# Patient Record
Sex: Female | Born: 1955 | Race: White | Hispanic: No | Marital: Married | Smoking: Former smoker
Health system: Southern US, Community
[De-identification: ages and names within clinical notes are randomized; demographics above are authoritative.]

## PROBLEM LIST (undated history)

## (undated) ENCOUNTER — Ambulatory Visit: Admission: EM | Source: Home / Self Care

## (undated) DIAGNOSIS — F419 Anxiety disorder, unspecified: Secondary | ICD-10-CM

## (undated) DIAGNOSIS — F32A Depression, unspecified: Secondary | ICD-10-CM

## (undated) DIAGNOSIS — C801 Malignant (primary) neoplasm, unspecified: Secondary | ICD-10-CM

## (undated) DIAGNOSIS — F329 Major depressive disorder, single episode, unspecified: Secondary | ICD-10-CM

## (undated) HISTORY — PX: TUBAL LIGATION: SHX77

---

## 2006-09-24 ENCOUNTER — Ambulatory Visit: Payer: Self-pay | Admitting: Internal Medicine

## 2006-11-22 ENCOUNTER — Ambulatory Visit: Payer: Self-pay | Admitting: Internal Medicine

## 2007-02-18 ENCOUNTER — Ambulatory Visit: Payer: Self-pay | Admitting: Internal Medicine

## 2008-07-15 ENCOUNTER — Ambulatory Visit: Payer: Self-pay | Admitting: Internal Medicine

## 2010-01-04 ENCOUNTER — Ambulatory Visit: Payer: Self-pay | Admitting: Internal Medicine

## 2010-03-25 ENCOUNTER — Ambulatory Visit: Payer: Self-pay | Admitting: Internal Medicine

## 2010-04-13 ENCOUNTER — Ambulatory Visit: Payer: Self-pay | Admitting: Family Medicine

## 2010-04-15 ENCOUNTER — Ambulatory Visit: Payer: Self-pay | Admitting: Family Medicine

## 2010-05-07 IMAGING — US US PELV - US TRANSVAGINAL
1 series · 17 of 25 positions shown · non-contrast
Comparison: none

REASON FOR EXAM: [id number] LOWER QUAD ABD PAIN  AND PELVIC PAIN  7 DAYS
AND LOW GRADE FEVER
COMMENTS:

PROCEDURE:     US  - US PELVIS MASS EXAM W/TRANSVAGI  - July 15, 2008  [DATE]
RESULT:     Comparison: No comparison
INDICATION: lower quadrant pain LMP 3 weeks ago
TECHNIQUE: Multiple transabdominal and endovaginal gray-scale images of the
pelvis performed.

[Series 1: us pelv - us transvaginal · 17 of 71 slices shown]
[im 1/71]
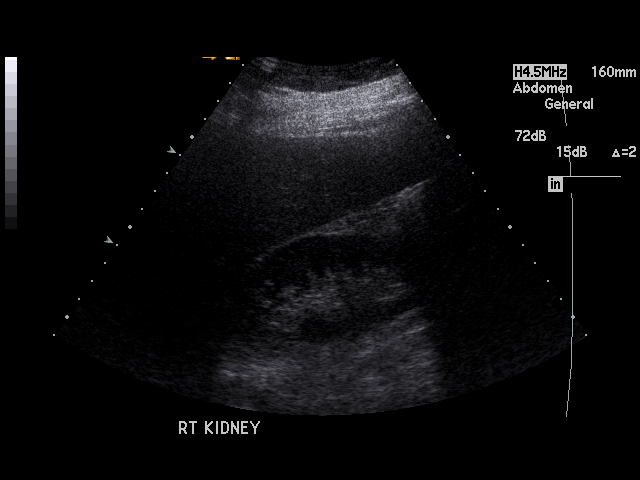
[im 6/71]
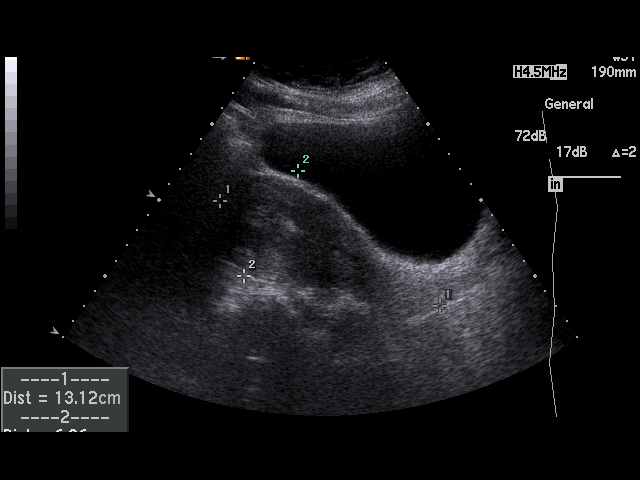
[im 9/71]
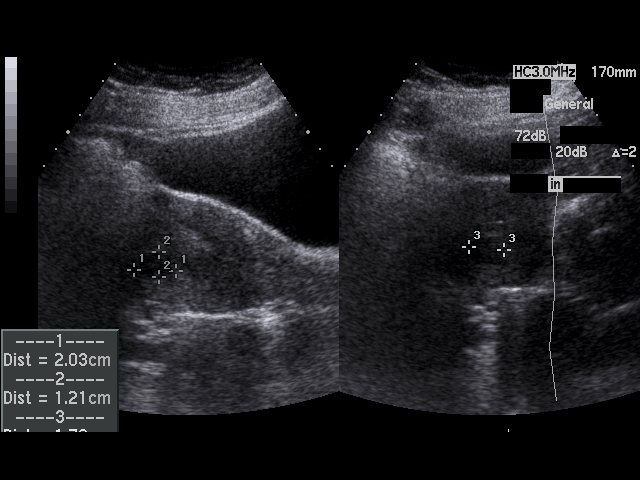
[im 15/71]
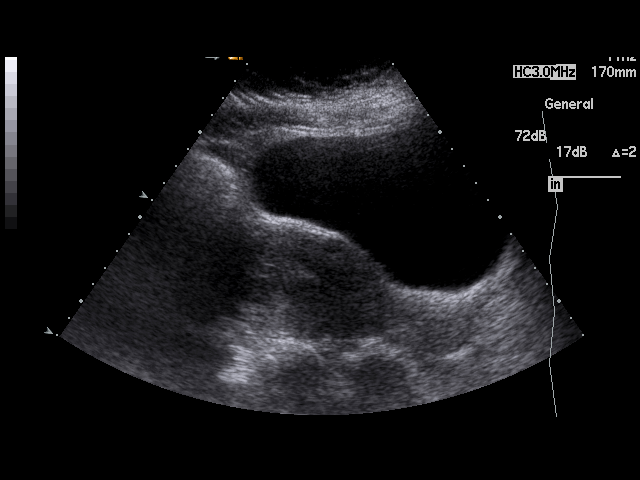
[im 18/71]
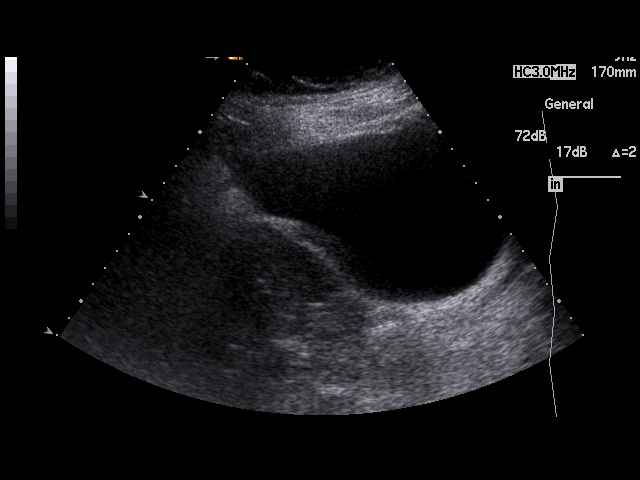
[im 24/71]
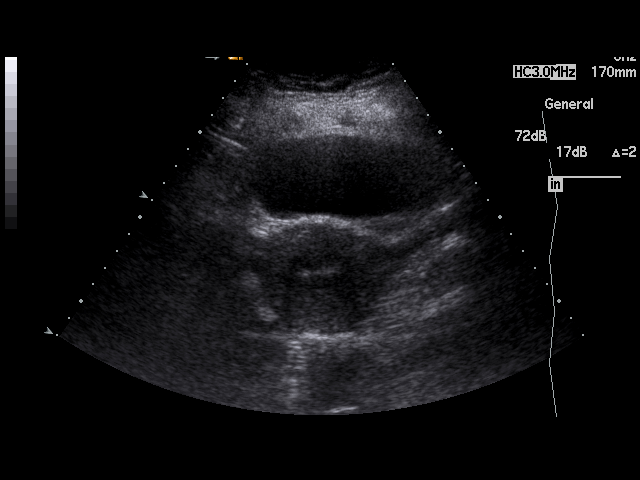
[im 27/71]
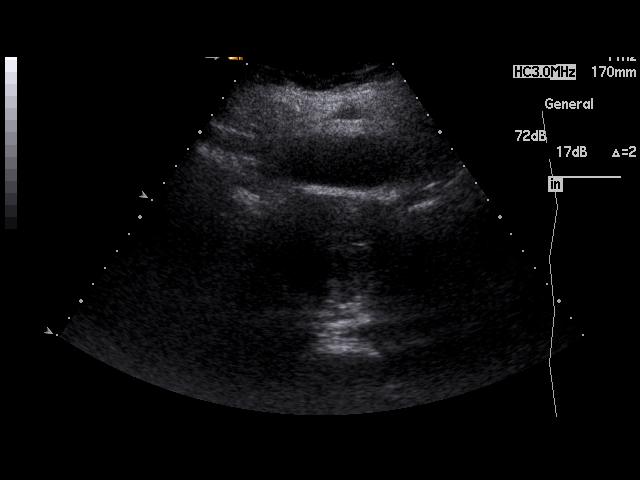
[im 33/71]
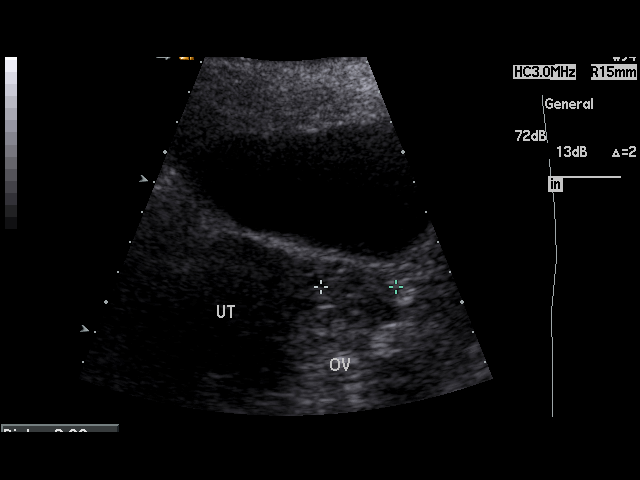
[im 36/71]
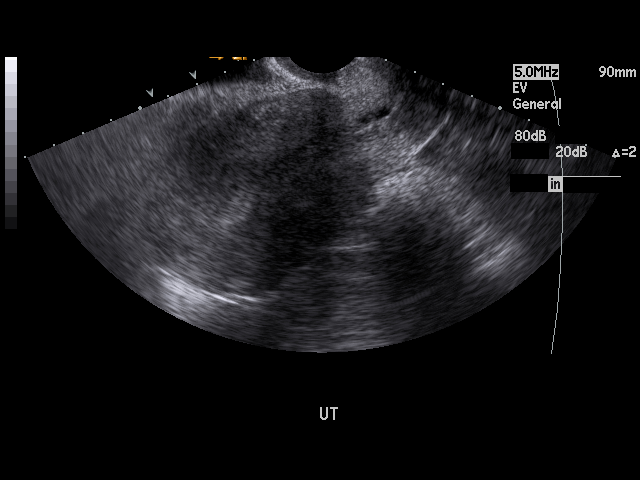
[im 38/71]
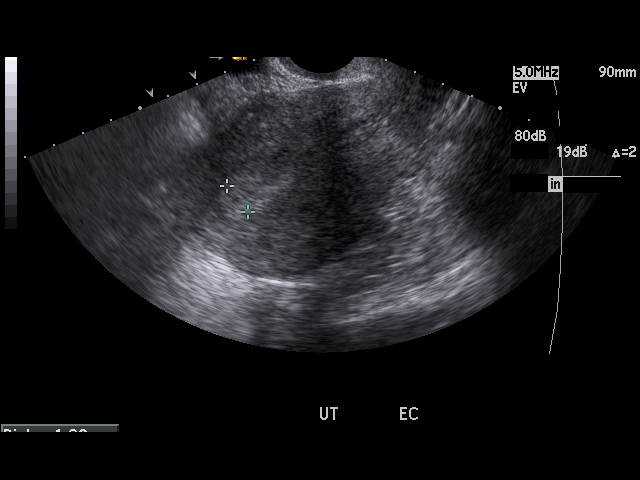
[im 44/71]
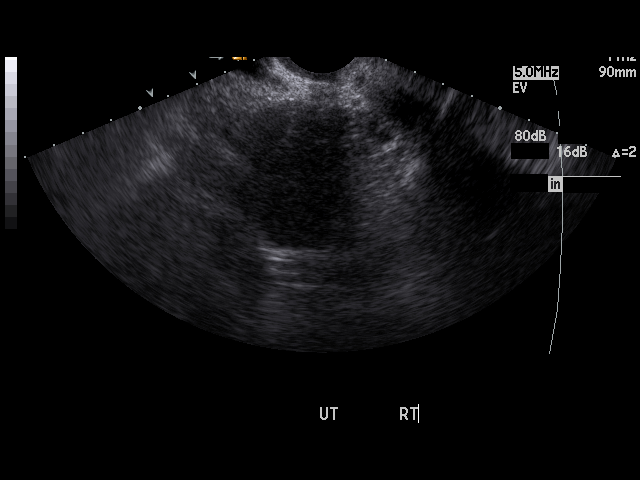
[im 47/71]
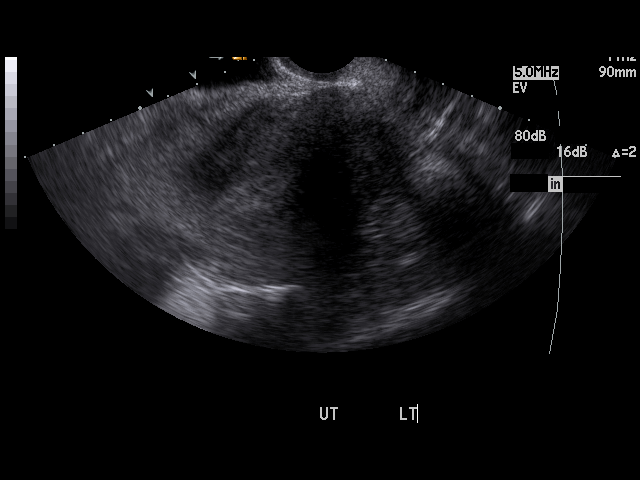
[im 53/71]
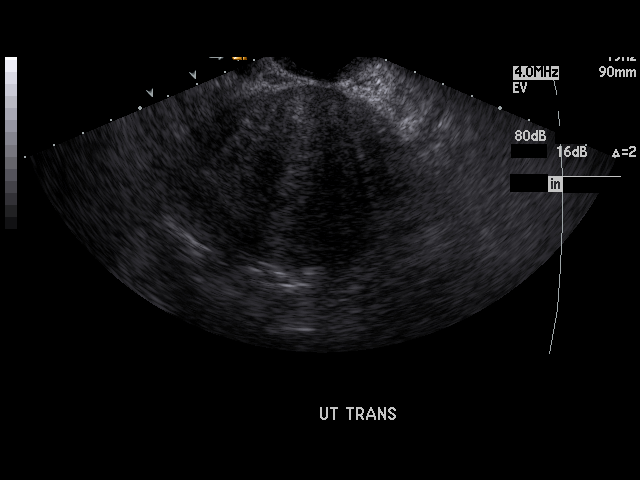
[im 56/71]
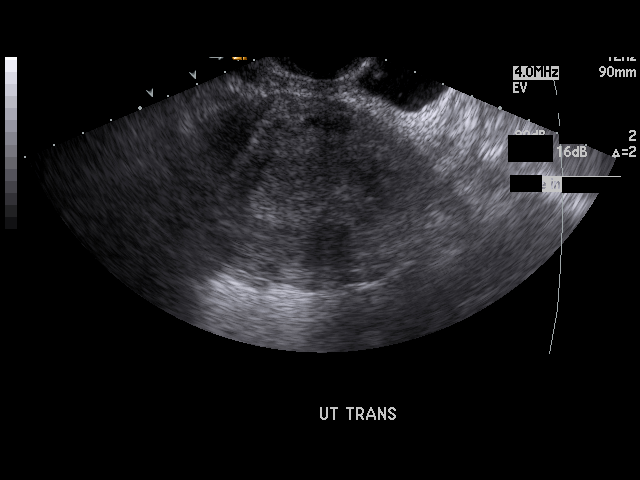
[im 62/71]
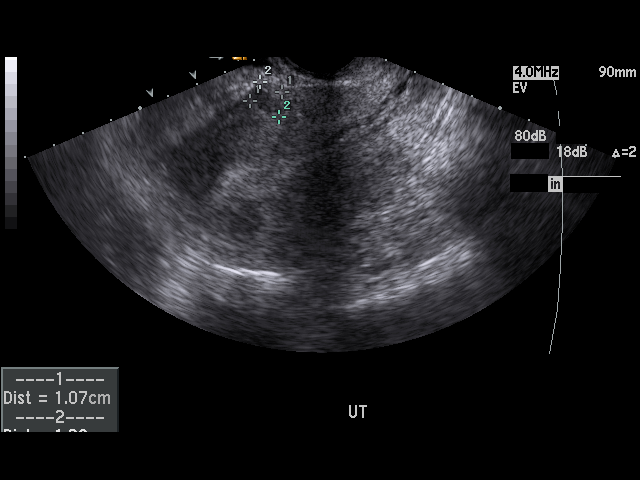
[im 65/71]
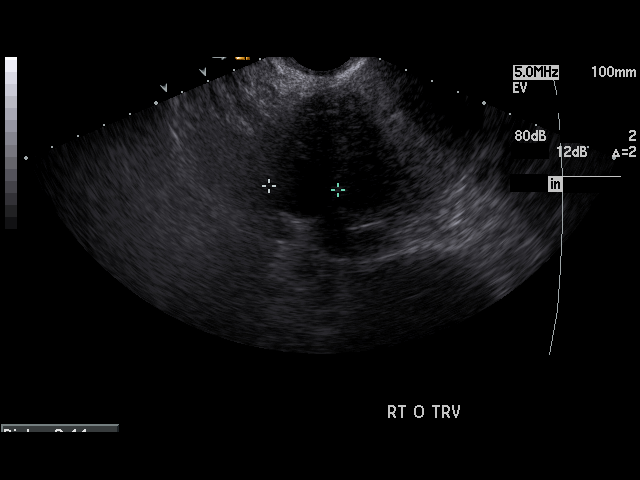
[im 71/71]
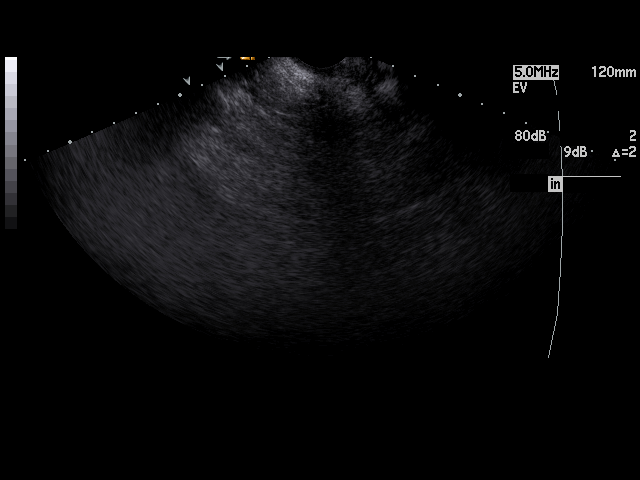

[17 of 25 positions shown; findings below may reference images not displayed]

FINDINGS: The uterus is normal in echotexture measuring 13.1 x 6.3 x 6.2 cm , with
transabdominal ultrasound. The endometrial stripe is uniform and homogeneous
measuring 13.5 mm.  There 2 small hypoechoic mass is, one in the anterior
fundus and one in the posterior fundus measuring 13 mm and 20 mm
respectively.

The right ovary measures 3.8 x 2.5 x 2.9 cm.  The left ovary measures 2.7 x
2 x 2.3 cm.  There is a right ovarian anechoic mass measuring 2.5 x 2.4 x
2.2 cm without internal septation or mural nodularity. The mass demonstrates
increased through transmission. This may represent a small right ovarian
cyst versus a dominant follicle.  There is no pelvic free fluid.
IMPRESSION: 1. Two hypoechoic uterine masses most consistent with fibroids.

## 2010-08-11 ENCOUNTER — Ambulatory Visit: Payer: Self-pay | Admitting: Internal Medicine

## 2011-02-21 HISTORY — PX: DILATION AND CURETTAGE OF UTERUS: SHX78

## 2011-10-11 ENCOUNTER — Ambulatory Visit: Payer: Self-pay | Admitting: Internal Medicine

## 2011-10-27 IMAGING — CR DG CHEST 2V
1 series · 2 of 2 positions shown · non-contrast
Comparison: none

REASON FOR EXAM: SOB with exertion
COMMENTS:   LMP: Four weeks ago

PROCEDURE:     MDR - MDR CHEST PA(OR AP) AND LATERAL  - January 04, 2010 [DATE]
RESULT:     The lungs are clear. The heart and pulmonary vessels are normal.
The bony and mediastinal structures are unremarkable. There is no effusion.
There is no pneumothorax or evidence of congestive failure.

[Series 1: view not recorded · 0.17mm/px · 2 of 2 slices shown]
[im 1/2]
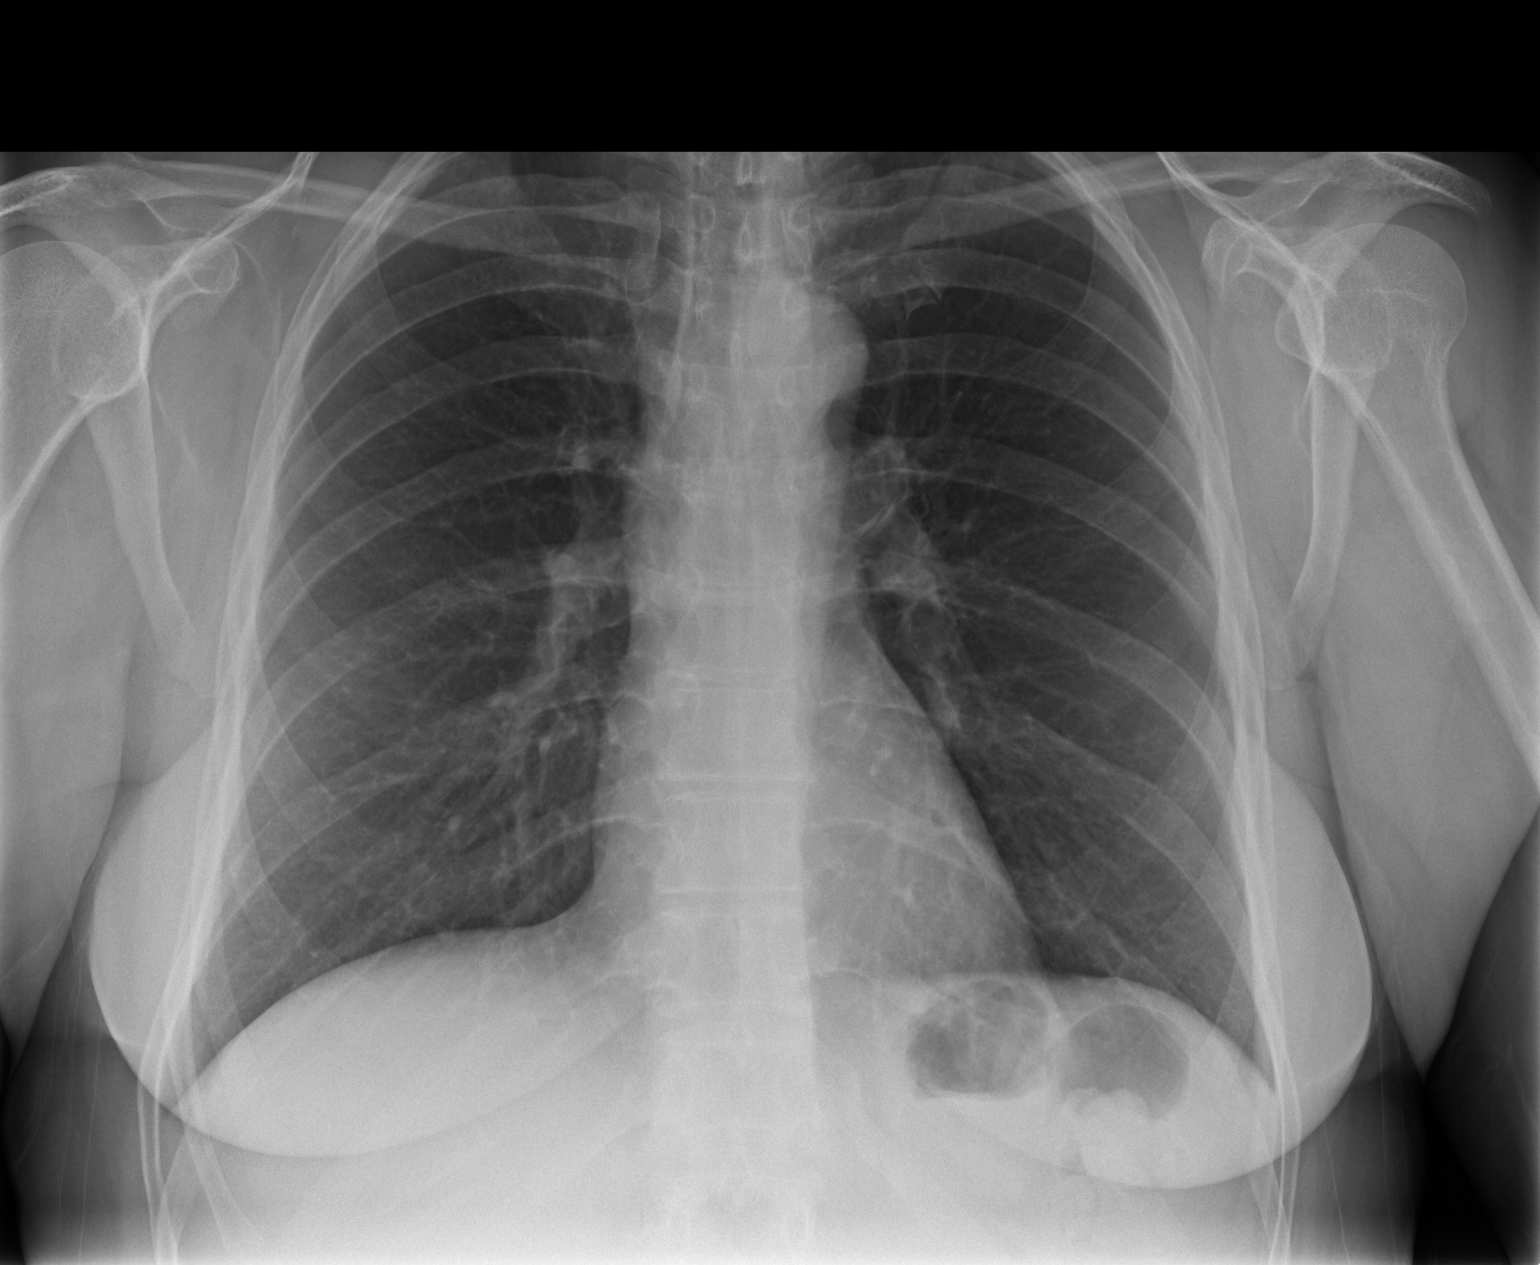
[im 2/2]
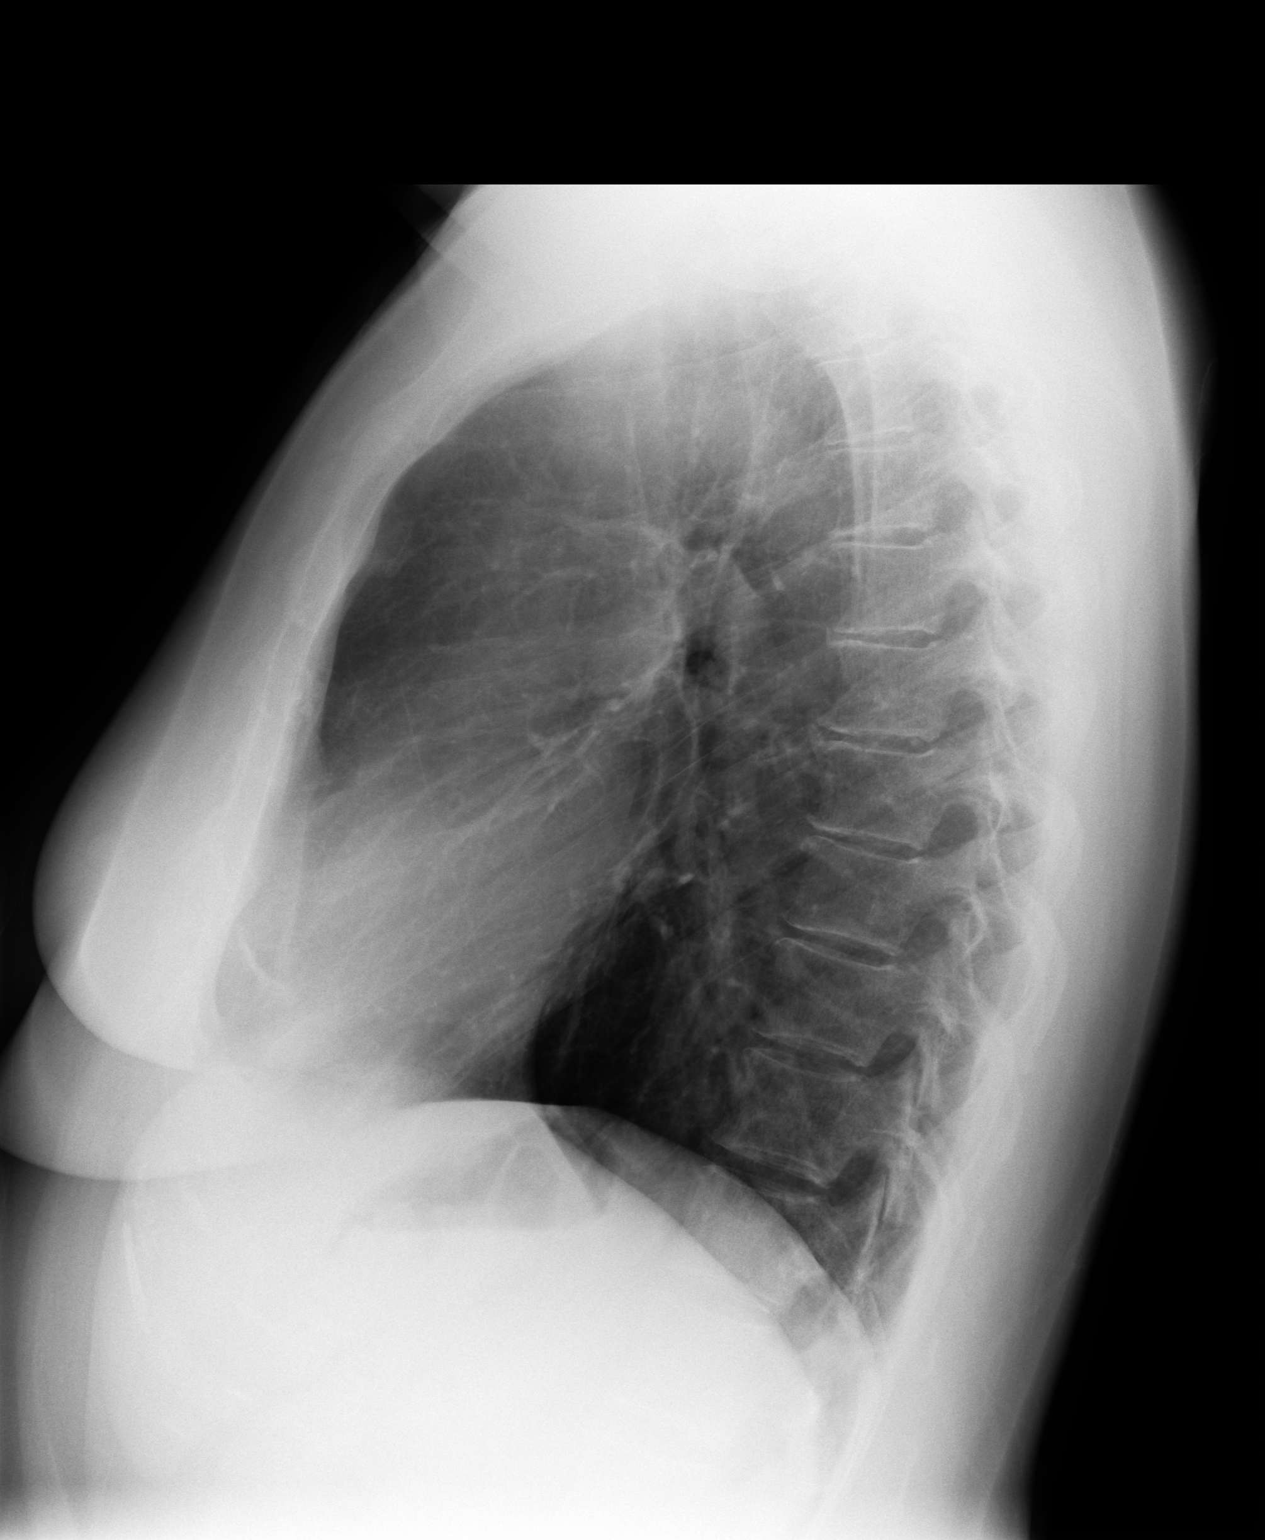

[2 of 2 positions shown; findings below may reference images not displayed]

IMPRESSION: No acute cardiopulmonary disease.

## 2012-02-21 HISTORY — PX: COLONOSCOPY: SHX5424

## 2012-02-23 ENCOUNTER — Ambulatory Visit: Payer: Self-pay

## 2012-02-23 LAB — RAPID STREP-A WITH REFLX: Micro Text Report: POSITIVE

## 2012-05-04 ENCOUNTER — Ambulatory Visit: Payer: Self-pay | Admitting: Emergency Medicine

## 2012-05-04 LAB — URINALYSIS, COMPLETE
Glucose,UR: NEGATIVE mg/dL (ref 0–75)
Nitrite: NEGATIVE
Ph: 6 (ref 4.5–8.0)
Protein: 300
Specific Gravity: >=1.03 (ref 1.003–1.030)
WBC UR: >30 /HPF (ref 0–5)

## 2012-05-06 LAB — URINE CULTURE

## 2012-06-07 ENCOUNTER — Ambulatory Visit: Payer: Self-pay | Admitting: Emergency Medicine

## 2012-06-07 LAB — CBC WITH DIFFERENTIAL/PLATELET
Basophil #: 0 10*3/uL (ref 0.0–0.1)
Basophil %: 0.4 %
Eosinophil #: 0.1 10*3/uL (ref 0.0–0.7)
Eosinophil %: 1.8 %
HCT: 40.8 % (ref 35.0–47.0)
HGB: 14 g/dL (ref 12.0–16.0)
Lymphocyte #: 1.5 10*3/uL (ref 1.0–3.6)
Lymphocyte %: 25.4 %
MCH: 29.9 pg (ref 26.0–34.0)
MCHC: 34.3 g/dL (ref 32.0–36.0)
MCV: 87 fL (ref 80–100)
Monocyte #: 0.4 x10 3/mm (ref 0.2–0.9)
Monocyte %: 7.6 %
Neutrophil #: 3.8 10*3/uL (ref 1.4–6.5)
Neutrophil %: 64.8 %
Platelet: 235 10*3/uL (ref 150–440)
RBC: 4.68 10*6/uL (ref 3.80–5.20)
RDW: 12.7 % (ref 11.5–14.5)
WBC: 5.8 10*3/uL (ref 3.6–11.0)

## 2012-06-07 LAB — RAPID STREP-A WITH REFLX: Micro Text Report: NEGATIVE

## 2012-06-09 LAB — BETA STREP CULTURE(ARMC)

## 2013-02-20 HISTORY — PX: MELANOMA EXCISION: SHX5266

## 2013-08-01 ENCOUNTER — Ambulatory Visit: Payer: Self-pay | Admitting: Internal Medicine

## 2013-08-12 ENCOUNTER — Ambulatory Visit: Payer: Self-pay | Admitting: Internal Medicine

## 2013-09-05 DIAGNOSIS — I4510 Unspecified right bundle-branch block: Secondary | ICD-10-CM | POA: Insufficient documentation

## 2014-11-17 ENCOUNTER — Telehealth: Payer: Self-pay

## 2014-11-17 ENCOUNTER — Other Ambulatory Visit: Payer: Self-pay | Admitting: Internal Medicine

## 2014-11-17 ENCOUNTER — Encounter: Payer: Self-pay | Admitting: Internal Medicine

## 2014-11-17 DIAGNOSIS — E785 Hyperlipidemia, unspecified: Secondary | ICD-10-CM | POA: Insufficient documentation

## 2014-11-17 DIAGNOSIS — F324 Major depressive disorder, single episode, in partial remission: Secondary | ICD-10-CM | POA: Insufficient documentation

## 2014-11-17 DIAGNOSIS — N951 Menopausal and female climacteric states: Secondary | ICD-10-CM | POA: Insufficient documentation

## 2014-11-17 DIAGNOSIS — M79606 Pain in leg, unspecified: Secondary | ICD-10-CM | POA: Insufficient documentation

## 2014-11-17 MED ORDER — SCOPOLAMINE 1 MG/3DAYS TD PT72
1.0000 | MEDICATED_PATCH | TRANSDERMAL | Status: DC
Start: 2014-11-17 — End: 2015-04-14

## 2014-11-17 NOTE — Telephone Encounter (Signed)
Wants patch for sea sick due to going deep sea fishing next week.dr

## 2015-02-01 ENCOUNTER — Ambulatory Visit
Admission: EM | Admit: 2015-02-01 | Discharge: 2015-02-01 | Disposition: A | Discharge status: Home / Self Care | Payer: BLUE CROSS/BLUE SHIELD | Attending: Family Medicine | Admitting: Family Medicine

## 2015-02-01 ENCOUNTER — Encounter: Payer: Self-pay | Admitting: Emergency Medicine

## 2015-02-01 DIAGNOSIS — N390 Urinary tract infection, site not specified: Secondary | ICD-10-CM | POA: Diagnosis not present

## 2015-02-01 LAB — URINALYSIS COMPLETE WITH MICROSCOPIC (ARMC ONLY)
GLUCOSE, UA: 100 mg/dL — AB
Nitrite: POSITIVE — AB
PROTEIN: 100 mg/dL — AB
Specific Gravity, Urine: <1.005 — ABNORMAL LOW (ref 1.005–1.030)
pH: 5 (ref 5.0–8.0)

## 2015-02-01 MED ORDER — SULFAMETHOXAZOLE-TRIMETHOPRIM 800-160 MG PO TABS: 1.0000 | ORAL_TABLET | Freq: Two times a day (BID) | ORAL | Status: AC

## 2015-02-01 NOTE — ED Provider Notes (Signed)
[facility]  ____________________________________________  Time seen: Approximately 11:32 AM  I have reviewed the triage vital signs and the nursing notes.   HISTORY  Chief Complaint Dysuria   HPI OTHEL GREBNER is a 59 y.o. femalepresents for complaints of 2 days of urinary frequency and urgency, reports also with burning with urination. Reports some low back aching pain, denies pain radiation. States low back pain is an aching pain at 5/10. States low back unchanged by movement. Denies fall or trauma or strenuous activity.Denies vaginal complaints. Denies hematuria. States one episode of diarrhea today. Denies stool color changes, blood in stool or dark colored stool. States took [place] yesterday.   Reports continues to eat and drink well. Denies fevers, chest pain, shortness of breath or other complaints. States last UTI 5 years ago, denies known trigger.    History reviewed. No pertinent past medical history.  Patient Active Problem List   Diagnosis Date Noted  . Dyslipidemia 11/17/2014  . 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 Hot flash, menopausal 11/17/2014  . Leg pain 11/17/2014    Past Surgical History  Procedure Laterality Date  . Melanoma excision  2015  . Tubal ligation    . Colonoscopy  2014    Current Outpatient Rx  Name  Route  Sig  Dispense  Refill  . buPROPion (WELLBUTRIN XL) 150 MG 24 hr tablet   Oral   Take 3 tablets by mouth daily.         . OMEGA-3 FATTY ACIDS PO   Oral   Take 1 capsule by mouth daily.         . pregabalin (LYRICA) 50 MG capsule   Oral   Take 3 capsules by mouth at bedtime.         Marland Kitchen scopolamine (TRANSDERM-SCOP) 1 MG/3DAYS   Transdermal   Place 1 patch (1.5 mg total) onto the skin every 3 (three) days.   4 patch   0    LMP: postmenopausal.   PCP: [facility]  Allergies Review of patient's allergies indicates no known allergies.  Family History  Problem Relation  Age of Onset  . Heart failure Mother   . Colon cancer Maternal Grandmother     Social History Social History  Substance Use Topics  . Smoking status: Former Research scientist (life sciences)  . Smokeless tobacco: None  . Alcohol Use: 1.2 oz/week    2 Standard drinks or equivalent per week    Review of Systems Constitutional: No fever/chills Eyes: No visual changes. ENT: No sore throat. Cardiovascular: Denies chest pain. Respiratory: Denies shortness of breath. Gastrointestinal: No abdominal pain.  No nausea, no vomiting.  No diarrhea.  No constipation. Genitourinary: positive for dysuria. Musculoskeletal: positive for back pain. Skin: Negative for rash. Neurological: Negative for headaches, focal weakness or numbness.  10-point ROS otherwise negative.  ____________________________________________   PHYSICAL EXAM:  VITAL SIGNS: ED Triage Vitals  Enc Vitals Group     BP 02/01/15 1107 124/87 mmHg     Pulse Rate 02/01/15 1107 63     Resp 02/01/15 1107 16     Temp 02/01/15 1107 96 F (35.6 C)     Temp Source 02/01/15 1107 Tympanic     SpO2 02/01/15 1107 98 %     Weight 02/01/15 1107 200 lb (90.719 kg)     Height 02/01/15 1107 6' (1.829 m)     Head Cir --      Peak Flow --  Pain Score 02/01/15 1110 4     Pain Loc --      Pain Edu? --      Excl. in [place]? --     Constitutional: Alert and oriented. Well appearing and in no acute distress. Eyes: Conjunctivae are normal. PERRL. EOMI. Head: Atraumatic.  Ears: no erythema, normal TMs bilaterally.  Nose: No congestion/rhinnorhea.  Mouth/Throat: Mucous membranes are moist.  Oropharynx non-erythematous. Neck: No stridor.  No cervical spine tenderness to palpation. Hematological/Lymphatic/Immunilogical: No cervical lymphadenopathy. Cardiovascular: Normal rate, regular rhythm. Grossly normal heart sounds.  Good peripheral circulation. Respiratory: Normal respiratory effort.  No retractions. Lungs CTAB. Good air movement.  Gastrointestinal: Soft  and nontender. No distention. Normal Bowel sounds.  No CVA tenderness. Musculoskeletal: No lower or upper extremity tenderness nor edema.  Bilateral pedal pulses equal and easily palpated. No cervical, thoracic, or lumbar TTP.  Neurologic:  Normal speech and language. No gross focal neurologic deficits are appreciated. No gait instability. Skin:  Skin is warm, dry and intact. No rash noted. Psychiatric: Mood and affect are normal. Speech and behavior are normal.  ____________________________________________   LABS (all labs ordered are listed, but only abnormal results are displayed)  Labs Reviewed  URINALYSIS COMPLETEWITH MICROSCOPIC ([facility] ONLY) - Abnormal; Notable for the following:    Color, Urine ORANGE (*)    APPearance CLOUDY (*)    Glucose, UA 100 (*)    Bilirubin Urine 1+ (*)    Ketones, ur TRACE (*)    Specific Gravity, Urine <1.005 (*)    Hgb urine dipstick 2+ (*)    Protein, ur 100 (*)    Nitrite POSITIVE (*)    Leukocytes, UA 3+ (*)    Bacteria, UA FEW (*)    Squamous Epithelial / LPF 0-5 (*)    All other components within normal limits  URINE CULTURE    INITIAL IMPRESSION / ASSESSMENT AND PLAN / ED COURSE  Pertinent labs & imaging results that were available during my care of the patient were reviewed by me and considered in my medical decision making (see chart for details).  Well appearing. No acute distress. Dysuria x 2 days. Abdomen soft and nontender, no CVA tenderness. Urinalysis positive nitrite, few bacteria, 3+ leukocytes, cloudy, hgb 2+, will culture. Will treat with oral bactrim and supportive treatments including rest, fluids and pcp follow up.   Discussed follow up with Primary care physician this week. Discussed follow up and return parameters including no resolution or any worsening concerns. Patient verbalized understanding and agreed to plan.   ____________________________________________   FINAL CLINICAL IMPRESSION(S) / ED DIAGNOSES  Final  diagnoses:  UTI (lower urinary tract infection)       Marylene Land, NP 02/01/15 1237

## 2015-02-01 NOTE — Discharge Instructions (Signed)
Take medication as prescribed. Rest. Drink plenty of fluids.   Follow up with your primary care physician this week as needed. Return to Urgent care as needed for new or worsening concerns.   Urinary Tract Infection Urinary tract infections (UTIs) can develop anywhere along your urinary tract. Your urinary tract is your body's drainage system for removing wastes and extra water. Your urinary tract includes two kidneys, two ureters, a bladder, and a urethra. Your kidneys are a pair of bean-shaped organs. Each kidney is about the size of your fist. They are located below your ribs, one on each side of your spine. CAUSES Infections are caused by microbes, which are microscopic organisms, including fungi, viruses, and bacteria. These organisms are so small that they can only be seen through a microscope. Bacteria are the microbes that most commonly cause UTIs. SYMPTOMS  Symptoms of UTIs may vary by age and gender of the patient and by the location of the infection. Symptoms in young women typically include a frequent and intense urge to urinate and a painful, burning feeling in the bladder or urethra during urination. Older women and men are more likely to be tired, shaky, and weak and have muscle aches and abdominal pain. A fever may mean the infection is in your kidneys. Other symptoms of a kidney infection include pain in your back or sides below the ribs, nausea, and vomiting. DIAGNOSIS To diagnose a UTI, your caregiver will ask you about your symptoms. Your caregiver will also ask you to provide a urine sample. The urine sample will be tested for bacteria and white blood cells. White blood cells are made by your body to help fight infection. TREATMENT  Typically, UTIs can be treated with medication. Because most UTIs are caused by a bacterial infection, they usually can be treated with the use of antibiotics. The choice of antibiotic and length of treatment depend on your symptoms and the type of  bacteria causing your infection. HOME CARE INSTRUCTIONS  If you were prescribed antibiotics, take them exactly as your caregiver instructs you. Finish the medication even if you feel better after you have only taken some of the medication.  Drink enough water and fluids to keep your urine clear or pale yellow.  Avoid caffeine, tea, and carbonated beverages. They tend to irritate your bladder.  Empty your bladder often. Avoid holding urine for long periods of time.  Empty your bladder before and after sexual intercourse.  After a bowel movement, women should cleanse from front to back. Use each tissue only once. SEEK MEDICAL CARE IF:   You have back pain.  You develop a fever.  Your symptoms do not begin to resolve within 3 days. SEEK IMMEDIATE MEDICAL CARE IF:   You have severe back pain or lower abdominal pain.  You develop chills.  You have nausea or vomiting.  You have continued burning or discomfort with urination. MAKE SURE YOU:   Understand these instructions.  Will watch your condition.  Will get help right away if you are not doing well or get worse.   This information is not intended to replace advice given to you by your health care provider. Make sure you discuss any questions you have with your health care provider.   Document Released: 11/16/2004 Document Revised: 10/28/2014 Document Reviewed: 03/17/2011 Elsevier Interactive Patient Education [employer].

## 2015-02-01 NOTE — ED Notes (Signed)
Patient c/o burning when urinating and lower back pain since yesterday.  Patient denies fever. Patient denies N/V.

## 2015-02-02 LAB — URINE CULTURE
CULTURE: NO GROWTH
Special Requests: NORMAL

## 2015-02-08 ENCOUNTER — Other Ambulatory Visit: Payer: Self-pay

## 2015-02-08 MED ORDER — BUPROPION HCL ER (XL) 150 MG PO TB24: 450.0000 mg | ORAL_TABLET | Freq: Every day | ORAL | Status: DC

## 2015-04-14 ENCOUNTER — Encounter: Payer: Self-pay | Admitting: Internal Medicine

## 2015-04-14 ENCOUNTER — Ambulatory Visit (INDEPENDENT_AMBULATORY_CARE_PROVIDER_SITE_OTHER): Payer: BLUE CROSS/BLUE SHIELD | Admitting: Internal Medicine

## 2015-04-14 VITALS — BP 112/64 | HR 72 | Ht 71.5 in | Wt 211.2 lb

## 2015-04-14 DIAGNOSIS — Z1159 Encounter for screening for other viral diseases: Secondary | ICD-10-CM

## 2015-04-14 DIAGNOSIS — Z23 Encounter for immunization: Secondary | ICD-10-CM

## 2015-04-14 DIAGNOSIS — Z124 Encounter for screening for malignant neoplasm of cervix: Secondary | ICD-10-CM | POA: Diagnosis not present

## 2015-04-14 DIAGNOSIS — Z1239 Encounter for other screening for malignant neoplasm of breast: Secondary | ICD-10-CM

## 2015-04-14 DIAGNOSIS — Z85820 Personal history of malignant melanoma of skin: Secondary | ICD-10-CM | POA: Diagnosis not present

## 2015-04-14 DIAGNOSIS — F324 Major depressive disorder, single episode, in partial remission: Secondary | ICD-10-CM | POA: Diagnosis not present

## 2015-04-14 DIAGNOSIS — Z9889 Other specified postprocedural states: Secondary | ICD-10-CM

## 2015-04-14 DIAGNOSIS — Z0000 Encounter for general adult medical examination without abnormal findings: Secondary | ICD-10-CM

## 2015-04-14 LAB — POCT URINALYSIS DIPSTICK
BILIRUBIN UA: NEGATIVE
Blood, UA: NEGATIVE
GLUCOSE UA: NEGATIVE
KETONES UA: NEGATIVE
LEUKOCYTES UA: NEGATIVE
Nitrite, UA: NEGATIVE
PH UA: 7
Spec Grav, UA: 1.015
Urobilinogen, UA: 0.2

## 2015-04-14 NOTE — Patient Instructions (Addendum)
Breast Self-Awareness Practicing breast self-awareness may pick up problems early, prevent significant medical complications, and possibly save your life. By practicing breast self-awareness, you can become familiar with how your breasts look and feel and if your breasts are changing. This allows you to notice changes early. It can also offer you some reassurance that your breast health is good. One way to learn what is normal for your breasts and whether your breasts are changing is to do a breast self-exam. If you find a lump or something that was not present in the past, it is best to contact your caregiver right away. Other findings that should be evaluated by your caregiver include nipple discharge, especially if it is bloody; skin changes or reddening; areas where the skin seems to be pulled in (retracted); or new lumps and bumps. Breast pain is seldom associated with cancer (malignancy), but should also be evaluated by a caregiver. HOW TO PERFORM A BREAST SELF-EXAM The best time to examine your breasts is 5-7 days after your menstrual period is over. During menstruation, the breasts are lumpier, and it may be more difficult to pick up changes. If you do not menstruate, have reached menopause, or had your uterus removed (hysterectomy), you should examine your breasts at regular intervals, such as monthly. If you are breastfeeding, examine your breasts after a feeding or after using a breast pump. Breast implants do not decrease the risk for lumps or tumors, so continue to perform breast self-exams as recommended. Talk to your caregiver about how to determine the difference between the implant and breast tissue. Also, talk about the amount of pressure you should use during the exam. Over time, you will become more familiar with the variations of your breasts and more comfortable with the exam. A breast self-exam requires you to remove all your clothes above the waist.  Look at your breasts and nipples.  Stand in front of a mirror in a room with good lighting. With your hands on your hips, push your hands firmly downward. Look for a difference in shape, contour, and size from one breast to the other (asymmetry). Asymmetry includes puckers, dips, or bumps. Also, look for skin changes, such as reddened or scaly areas on the breasts. Look for nipple changes, such as discharge, dimpling, repositioning, or redness.  Carefully feel your breasts. This is best done either in the shower or tub while using soapy water or when flat on your back. Place the arm (on the side of the breast you are examining) above your head. Use the pads (not the fingertips) of your three middle fingers on your opposite hand to feel your breasts. Start in the underarm area and use  inch (2 cm) overlapping circles to feel your breast. Use 3 different levels of pressure (light, medium, and firm pressure) at each circle before moving to the next circle. The light pressure is needed to feel the tissue closest to the skin. The medium pressure will help to feel breast tissue a little deeper, while the firm pressure is needed to feel the tissue close to the ribs. Continue the overlapping circles, moving downward over the breast until you feel your ribs below your breast. Then, move one finger-width towards the center of the body. Continue to use the  inch (2 cm) overlapping circles to feel your breast as you move slowly up toward the collar bone (clavicle) near the base of the neck. Continue the up and down exam using all 3 pressures until you reach the  middle of the chest. Do this with each breast, carefully feeling for lumps or changes.   Keep a written record with breast changes or normal findings for each breast. By writing this information down, you do not need to depend only on memory for size, tenderness, or location. Write down where you are in your menstrual cycle, if you are still menstruating. Breast tissue can have some lumps or thick  tissue. However, see your caregiver if you find anything that concerns you.  SEEK MEDICAL CARE IF:  You see a change in shape, contour, or size of your breasts or nipples.   You see skin changes, such as reddened or scaly areas on the breasts or nipples.   You have an unusual discharge from your nipples.   You feel a new lump or unusually thick areas.    This information is not intended to replace advice given to you by your health care provider. Make sure you discuss any questions you have with your health care provider.   Document Released: 02/06/2005 Document Revised: 01/24/2012 Document Reviewed: 05/24/2011 Elsevier Interactive Patient Education 2016 [employer]. Tdap Vaccine (Tetanus, Diphtheria and Pertussis): What You Need to Know 1. Why get vaccinated? Tetanus, diphtheria and pertussis are very serious diseases. Tdap vaccine can protect [place] from these diseases. And, Tdap vaccine given to pregnant women can protect newborn babies against pertussis. TETANUS (Lockjaw) is rare in the [place] today. It causes painful muscle tightening and stiffness, usually all over the body.  It can lead to tightening of muscles in the head and neck so you can't open your mouth, swallow, or sometimes even breathe. Tetanus kills about 1 out of 10 people who are infected even after receiving the best medical care. DIPHTHERIA is also rare in the [place] today. It can cause a thick coating to form in the back of the throat.  It can lead to breathing problems, heart failure, paralysis, and death. PERTUSSIS (Whooping Cough) causes severe coughing spells, which can cause difficulty breathing, vomiting and disturbed sleep.  It can also lead to weight loss, incontinence, and rib fractures. Up to 2 in 100 adolescents and 5 in 100 adults with pertussis are hospitalized or have complications, which could include pneumonia or death. These diseases are caused by bacteria. Diphtheria and pertussis  are spread from person to person through secretions from coughing or sneezing. Tetanus enters the body through cuts, scratches, or wounds. Before vaccines, as many as 200,000 cases of diphtheria, 200,000 cases of pertussis, and hundreds of cases of tetanus, were reported in the [place] each year. Since vaccination began, reports of cases for tetanus and diphtheria have dropped by about 99% and for pertussis by about 80%. 2. Tdap vaccine Tdap vaccine can protect adolescents and adults from tetanus, diphtheria, and pertussis. One dose of Tdap is routinely given at age 44 or 71. People who did not get Tdap at that age should get it as soon as possible. Tdap is especially important for healthcare professionals and anyone having close contact with a baby younger than 12 months. Pregnant women should get a dose of Tdap during every pregnancy, to protect the newborn from pertussis. Infants are most at risk for severe, life-threatening complications from pertussis. Another vaccine, called Td, protects against tetanus and diphtheria, but not pertussis. A Td booster should be given every 10 years. Tdap may be given as one of these boosters if you have never gotten Tdap before. Tdap may also be given after a severe  cut or burn to prevent tetanus infection. Your doctor or the person giving you the vaccine can give you more information. Tdap may safely be given at the same time as other vaccines. 3. Some people should not get this vaccine  A person who has ever had a life-threatening allergic reaction after a previous dose of any diphtheria, tetanus or pertussis containing vaccine, OR has a severe allergy to any part of this vaccine, should not get Tdap vaccine. Tell the person giving the vaccine about any severe allergies.  Anyone who had coma or long repeated seizures within 7 days after a childhood dose of DTP or DTaP, or a previous dose of Tdap, should not get Tdap, unless a cause other than the vaccine  was found. They can still get Td.  Talk to your doctor if you:  have seizures or another nervous system problem,  had severe pain or swelling after any vaccine containing diphtheria, tetanus or pertussis,  ever had a condition called Guillain-Barr Syndrome (GBS),  aren't feeling well on the day the shot is scheduled. 4. Risks With any medicine, including vaccines, there is a chance of side effects. These are usually mild and go away on their own. Serious reactions are also possible but are rare. Most people who get Tdap vaccine do not have any problems with it. Mild problems following Tdap (Did not interfere with activities)  Pain where the shot was given (about 3 in 4 adolescents or 2 in 3 adults)  Redness or swelling where the shot was given (about 1 person in 5)  Mild fever of at least 100.30F (up to about 1 in 25 adolescents or 1 in 100 adults)  Headache (about 3 or 4 people in 10)  Tiredness (about 1 person in 3 or 4)  Nausea, vomiting, diarrhea, stomach ache (up to 1 in 4 adolescents or 1 in 10 adults)  Chills, sore joints (about 1 person in 10)  Body aches (about 1 person in 3 or 4)  Rash, swollen glands (uncommon) Moderate problems following Tdap (Interfered with activities, but did not require medical attention)  Pain where the shot was given (up to 1 in 5 or 6)  Redness or swelling where the shot was given (up to about 1 in 16 adolescents or 1 in 12 adults)  Fever over 102F (about 1 in 100 adolescents or 1 in 250 adults)  Headache (about 1 in 7 adolescents or 1 in 10 adults)  Nausea, vomiting, diarrhea, stomach ache (up to 1 or 3 people in 100)  Swelling of the entire arm where the shot was given (up to about 1 in 500). Severe problems following Tdap (Unable to perform usual activities; required medical attention)  Swelling, severe pain, bleeding and redness in the arm where the shot was given (rare). Problems that could happen after any  vaccine:  People sometimes faint after a medical procedure, including vaccination. Sitting or lying down for about 15 minutes can help prevent fainting, and injuries caused by a fall. Tell your doctor if you feel dizzy, or have vision changes or ringing in the ears.  Some people get severe pain in the shoulder and have difficulty moving the arm where a shot was given. This happens very rarely.  Any medication can cause a severe allergic reaction. Such reactions from a vaccine are very rare, estimated at fewer than 1 in a million doses, and would happen within a few minutes to a few hours after the vaccination. As with any medicine,  there is a very remote chance of a vaccine causing a serious injury or death. The safety of vaccines is always being monitored. For more information, visit: http://www.aguilar.org/ 5. What if there is a serious problem? What should I look for?  Look for anything that concerns you, such as signs of a severe allergic reaction, very high fever, or unusual behavior.  Signs of a severe allergic reaction can include hives, swelling of the face and throat, difficulty breathing, a fast heartbeat, dizziness, and weakness. These would usually start a few minutes to a few hours after the vaccination. What should I do?  If you think it is a severe allergic reaction or other emergency that can't wait, call 9-1-1 or get the person to the nearest hospital. Otherwise, call your doctor.  Afterward, the reaction should be reported to the Vaccine Adverse Event Reporting System (VAERS). Your doctor might file this report, or you can do it yourself through the VAERS web site at www.vaers.SamedayNews.es, or by calling (613)655-6432. VAERS does not give medical advice.  6. The National Vaccine Injury Compensation Program The [employer] Vaccine Injury Compensation Program (VICP) is a federal program that was created to compensate people who may have been injured by certain vaccines. Persons who  believe they may have been injured by a vaccine can learn about the program and about filing a claim by calling 435-530-2474 or visiting the [employer] website at GoldCloset.com.ee. There is a time limit to file a claim for compensation. 7. How can I learn more?  Ask your doctor. He or she can give you the vaccine package insert or suggest other sources of information.  Call your local or state health department.  Contact the Centers for Disease Control and Prevention (CDC):  Call 956-597-4125 (1-800-CDC-INFO) or  Visit CDC's website at http://hunter.com/ CDC Tdap Vaccine VIS (04/15/13)   This information is not intended to replace advice given to you by your health care provider. Make sure you discuss any questions you have with your health care provider.   Document Released: 08/08/2011 Document Revised: 02/27/2014 Document Reviewed: 05/21/2013 Elsevier Interactive Patient Education [employer].

## 2015-04-14 NOTE — Progress Notes (Signed)
Date:  04/14/2015   Name:  Brandi Nguyen   DOB:  [birth year]   MRN:  [id number]   Chief Complaint: Annual Exam PHELICIA LADWIG is a 60 y.o. female who presents today for her Complete Annual Exam. She feels well. She reports exercising regularly. She reports she is sleeping well. She denies breast problems.  She is due for a mammogram. She is planning to try to donate a kidney to her 26 year old nephew in [place]. As a preliminary to that she needs a mammogram and a Pap smear.  Depression        The onset quality is undetermined.   The problem has been resolved since onset.  Associated symptoms include no fatigue and no headaches.  Past treatments include other medications.  Compliance with treatment is good.  Previous treatment provided significant (wellbutrin) relief.    Review of Systems  Constitutional: Negative for fever, chills and fatigue.  HENT: Negative for hearing loss.   Eyes: Negative for visual disturbance.  Respiratory: Negative for cough, chest tightness and wheezing.   Cardiovascular: Negative for chest pain, palpitations and leg swelling.  Gastrointestinal: Negative for abdominal pain, diarrhea and constipation.  Genitourinary: Negative for urgency, hematuria, vaginal bleeding, vaginal discharge, genital sores and dyspareunia.  Musculoskeletal: Positive for arthralgias.  Skin: Positive for rash (lesion on vulva needs MOHS surgery).  Neurological: Negative for dizziness, light-headedness and headaches.  Hematological: Negative for adenopathy.  Psychiatric/Behavioral: Positive for depression.    Patient Active Problem List   Diagnosis Date Noted  . Hx of melanoma excision 04/14/2015  . Dyslipidemia 11/17/2014  . Depression, major, single episode, in partial remission ([place]) 11/17/2014  . Hot flash, menopausal 11/17/2014  . Leg pain 11/17/2014  . Bundle branch block, right 09/05/2013    Prior to Admission medications   Medication Sig Start Date End Date  Taking? Authorizing Provider  buPROPion (WELLBUTRIN XL) 150 MG 24 hr tablet Take 3 tablets (450 mg total) by mouth daily. 02/08/15   Glean Hess, MD  OMEGA-3 FATTY ACIDS PO Take 1 capsule by mouth daily.    Historical Provider, MD  pregabalin (LYRICA) 50 MG capsule Take 3 capsules by mouth at bedtime. 01/28/14   Historical Provider, MD  scopolamine (TRANSDERM-SCOP) 1 MG/3DAYS Place 1 patch (1.5 mg total) onto the skin every 3 (three) days. 11/17/14   Glean Hess, MD    Allergies  Allergen Reactions  . Pregabalin Other (See Comments)    Causes aches and stiffness    Past Surgical History  Procedure Laterality Date  . Melanoma excision  2015  . Tubal ligation    . Colonoscopy  2014    Social History  Substance Use Topics  . Smoking status: Former Research scientist (life sciences)  . Smokeless tobacco: None  . Alcohol Use: 1.2 oz/week    2 Standard drinks or equivalent per week     Medication list has been reviewed and updated.   Physical Exam  Constitutional: She is oriented to person, place, and time. She appears well-developed and well-nourished. No distress.  HENT:  Head: Normocephalic and atraumatic.  Right Ear: Tympanic membrane and ear canal normal.  Left Ear: Tympanic membrane and ear canal normal.  Nose: Right sinus exhibits no maxillary sinus tenderness. Left sinus exhibits no maxillary sinus tenderness.  Mouth/Throat: Uvula is midline and oropharynx is clear and moist.  Eyes: Conjunctivae and EOM are normal. Right eye exhibits no discharge. Left eye exhibits no discharge. No scleral icterus.  Neck: Normal range of  motion. Carotid bruit is not present. No erythema present. No thyromegaly present.  Cardiovascular: Normal rate, regular rhythm, normal heart sounds and normal pulses.   Pulmonary/Chest: Effort normal. No respiratory distress. She has no wheezes. Right breast exhibits no mass, no nipple discharge, no skin change and no tenderness. Left breast exhibits no mass, no nipple  discharge, no skin change and no tenderness.  Abdominal: Soft. Bowel sounds are normal. There is no hepatosplenomegaly. There is no tenderness. There is no CVA tenderness.  Genitourinary: Rectum normal, vagina normal and uterus normal. There is no rash or tenderness on the right labia. There is no rash or tenderness on the left labia. Cervix exhibits no motion tenderness and no discharge. Right adnexum displays no mass, no tenderness and no fullness. Left adnexum displays no mass, no tenderness and no fullness.  Musculoskeletal: Normal range of motion.  Lymphadenopathy:    She has no cervical adenopathy.    She has no axillary adenopathy.  Neurological: She is alert and oriented to person, place, and time. She has normal reflexes. No cranial nerve deficit or sensory deficit.  Skin: Skin is warm, dry and intact. No rash noted.  Psychiatric: She has a normal mood and affect. Her speech is normal and behavior is normal. Thought content normal.  Nursing note and vitals reviewed.   BP 112/64 mmHg  Pulse 72  Ht 5' 11.5" (1.816 m)  Wt 211 lb 3.2 oz (95.8 kg)  BMI 29.05 kg/m2  Assessment and Plan: 1. Annual physical exam Normal exam - Should be able to proceed with kidney donor evaluation - POCT urinalysis dipstick - CBC with Differential/Platelet - Comprehensive metabolic panel - Lipid panel  2. Breast cancer screening - MM DIGITAL SCREENING BILATERAL; Future  3. Cervical cancer screening - Pap IG and HPV (high risk) DNA detection  4. Depression, major, single episode, in partial remission ([place]) Doing well on medication - TSH  5. Need for hepatitis C screening test - Hepatitis C antibody  6. Need for diphtheria-tetanus-pertussis (Tdap) vaccine - Tdap vaccine greater than or equal to 7yo IM  7. Hx of melanoma excision Followed by Dermatology   Halina Maidens, MD [facility]  04/14/2015

## 2015-04-15 ENCOUNTER — Telehealth: Payer: Self-pay

## 2015-04-15 LAB — COMPREHENSIVE METABOLIC PANEL
ALBUMIN: 4.3 g/dL (ref 3.5–5.5)
ALK PHOS: 97 IU/L (ref 39–117)
ALT: 26 IU/L (ref 0–32)
AST: 24 IU/L (ref 0–40)
Albumin/Globulin Ratio: 1.9 (ref 1.1–2.5)
BUN/Creatinine Ratio: 19 (ref 9–23)
BUN: 15 mg/dL (ref 6–24)
Bilirubin Total: 0.5 mg/dL (ref 0.0–1.2)
CALCIUM: 9.5 mg/dL (ref 8.7–10.2)
CO2: 28 mmol/L (ref 18–29)
CREATININE: 0.77 mg/dL (ref 0.57–1.00)
Chloride: 100 mmol/L (ref 96–106)
GFR calc Af Amer: 99 mL/min/{1.73_m2} (ref >59)
GFR, EST NON AFRICAN AMERICAN: 86 mL/min/{1.73_m2} (ref >59)
GLOBULIN, TOTAL: 2.3 g/dL (ref 1.5–4.5)
GLUCOSE: 77 mg/dL (ref 65–99)
Potassium: 4.4 mmol/L (ref 3.5–5.2)
Sodium: 143 mmol/L (ref 134–144)
Total Protein: 6.6 g/dL (ref 6.0–8.5)

## 2015-04-15 LAB — LIPID PANEL
CHOL/HDL RATIO: 4.5 ratio — AB (ref 0.0–4.4)
CHOLESTEROL TOTAL: 282 mg/dL — AB (ref 100–199)
HDL: 62 mg/dL (ref >39)
LDL Calculated: 203 mg/dL — ABNORMAL HIGH (ref 0–99)
Triglycerides: 86 mg/dL (ref 0–149)
VLDL CHOLESTEROL CAL: 17 mg/dL (ref 5–40)

## 2015-04-15 LAB — CBC WITH DIFFERENTIAL/PLATELET
BASOS ABS: 0 10*3/uL (ref 0.0–0.2)
Basos: 0 %
EOS (ABSOLUTE): 0.1 10*3/uL (ref 0.0–0.4)
EOS: 1 %
HEMATOCRIT: 39.6 % (ref 34.0–46.6)
HEMOGLOBIN: 13.1 g/dL (ref 11.1–15.9)
IMMATURE GRANULOCYTES: 0 %
Immature Grans (Abs): 0 10*3/uL (ref 0.0–0.1)
LYMPHS ABS: 1.6 10*3/uL (ref 0.7–3.1)
Lymphs: 46 %
MCH: 29 pg (ref 26.6–33.0)
MCHC: 33.1 g/dL (ref 31.5–35.7)
MCV: 88 fL (ref 79–97)
MONOCYTES: 8 %
Monocytes Absolute: 0.3 10*3/uL (ref 0.1–0.9)
NEUTROS PCT: 45 %
Neutrophils Absolute: 1.6 10*3/uL (ref 1.4–7.0)
Platelets: 227 10*3/uL (ref 150–379)
RBC: 4.52 x10E6/uL (ref 3.77–5.28)
RDW: 13.6 % (ref 12.3–15.4)
WBC: 3.5 10*3/uL (ref 3.4–10.8)

## 2015-04-15 LAB — HEPATITIS C ANTIBODY

## 2015-04-15 LAB — TSH: TSH: 1.46 u[IU]/mL (ref 0.450–4.500)

## 2015-04-15 NOTE — Telephone Encounter (Signed)
Spoke with patient. Patient advised of all results and verbalized understanding. Will call back with any future questions or concerns. MAH  

## 2015-04-15 NOTE — Telephone Encounter (Signed)
Left message for patient to call back  

## 2015-04-15 NOTE — Telephone Encounter (Signed)
-----   Message from Glean Hess, MD sent at 04/15/2015 12:22 PM EST ----- Labs are normal except for high cholesterol - because HDL is elevated, the overall risk is only moderate.  Pap smear is still pending.  Hep C is negative.

## 2015-04-17 LAB — PAP IG AND HPV HIGH-RISK: PAP Smear Comment: 0

## 2015-04-17 LAB — HPV, LOW VOLUME (REFLEX): HPV, LOW VOL REFLEX: NEGATIVE

## 2015-04-19 ENCOUNTER — Telehealth: Payer: Self-pay

## 2015-04-19 NOTE — Telephone Encounter (Signed)
-----   Message from Glean Hess, MD sent at 04/17/2015  4:04 PM EST ----- Pap is normal with negative HPV.

## 2015-04-19 NOTE — Telephone Encounter (Signed)
Spoke with patient. Patient advised of all results and verbalized understanding. Will call back with any future questions or concerns. MAH  

## 2015-04-26 ENCOUNTER — Ambulatory Visit: Payer: BLUE CROSS/BLUE SHIELD

## 2015-04-27 ENCOUNTER — Ambulatory Visit
Admission: RE | Admit: 2015-04-27 | Discharge: 2015-04-27 | Disposition: A | Discharge status: Home / Self Care | Payer: BLUE CROSS/BLUE SHIELD | Source: Ambulatory Visit | Attending: Internal Medicine | Admitting: Internal Medicine

## 2015-04-27 DIAGNOSIS — Z1231 Encounter for screening mammogram for malignant neoplasm of breast: Secondary | ICD-10-CM | POA: Diagnosis present

## 2015-04-27 DIAGNOSIS — Z1239 Encounter for other screening for malignant neoplasm of breast: Secondary | ICD-10-CM

## 2015-04-27 HISTORY — DX: Malignant (primary) neoplasm, unspecified: C80.1

## 2015-05-19 ENCOUNTER — Encounter: Payer: Self-pay | Admitting: Internal Medicine

## 2015-05-19 ENCOUNTER — Telehealth: Payer: Self-pay

## 2015-05-19 NOTE — Telephone Encounter (Signed)
Spoke with patient. Patient advised. Patient will call [facility] since that is whom she saw.

## 2015-05-19 NOTE — Telephone Encounter (Signed)
The hormone replacement therapy was stopped in 2013 for post-menopausal bleeding.  She had an abnormal pelvic US and was referred to GYN.  My records indicate that she underwent a D&C. If she feels that she needs HRT again, she will need to consult GYN.  I do not have the record of who she saw.

## 2015-05-19 NOTE — Telephone Encounter (Signed)
Patient called in today and states that she would like to go back on her patch for her hot flashes. Patient states that she is having hot flashes and mood swings.

## 2015-05-24 IMAGING — US ABDOMEN ULTRASOUND LIMITED
1 series · 14 of 25 positions shown · non-contrast
Comparison: 04/15/2010

CLINICAL DATA: Right upper quadrant pain.

EXAM:
US ABDOMEN LIMITED - RIGHT UPPER QUADRANT

[Series 1: abdomen ultrasound limited · 0.34mm/px · 14 of 45 slices shown]
[im 1/45]
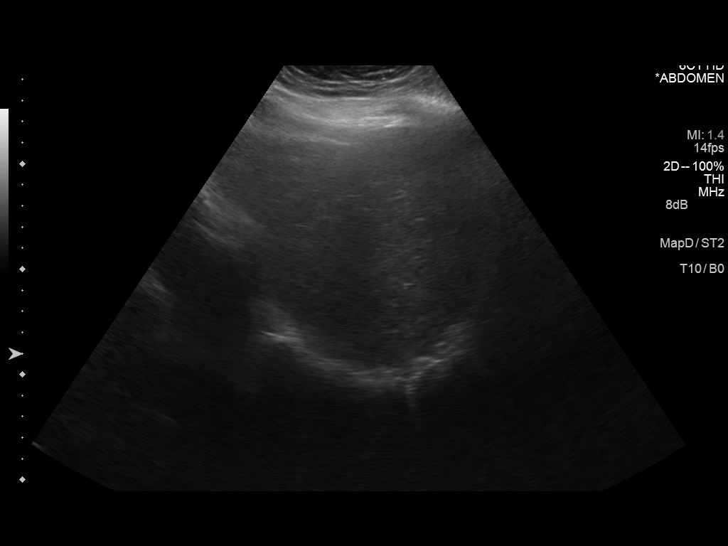
[im 4/45]
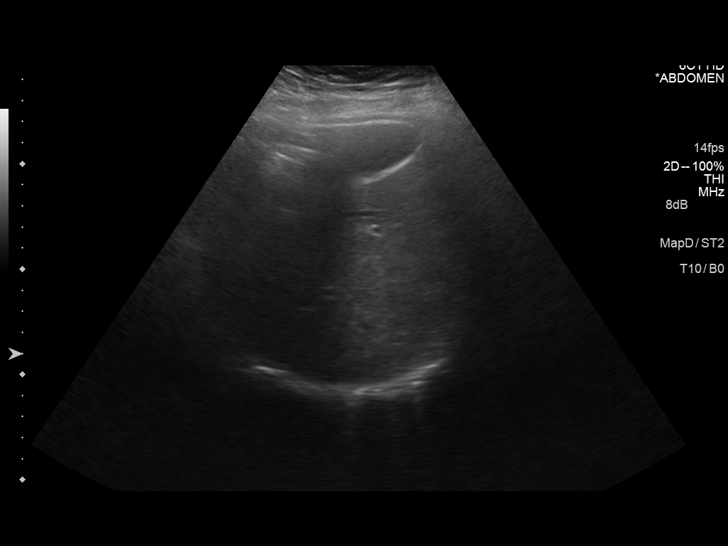
[im 8/45]
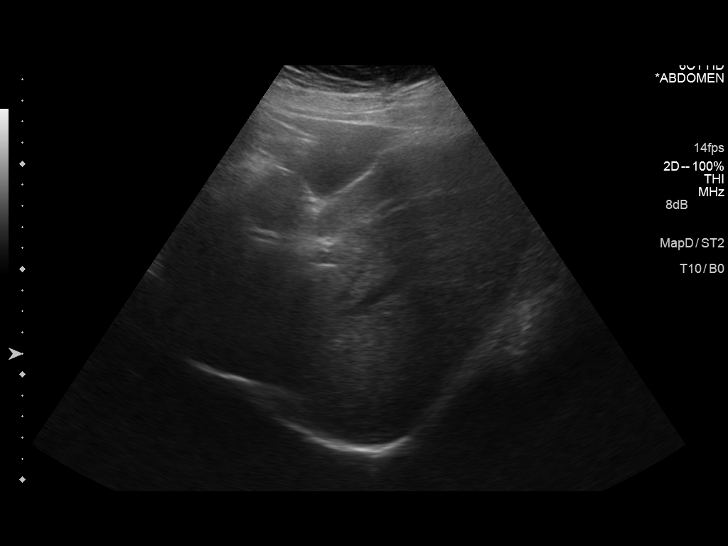
[im 12/45]
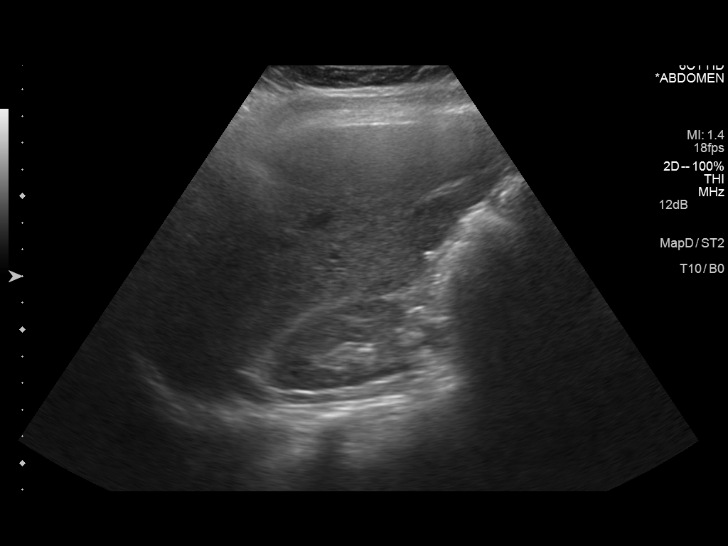
[im 15/45]
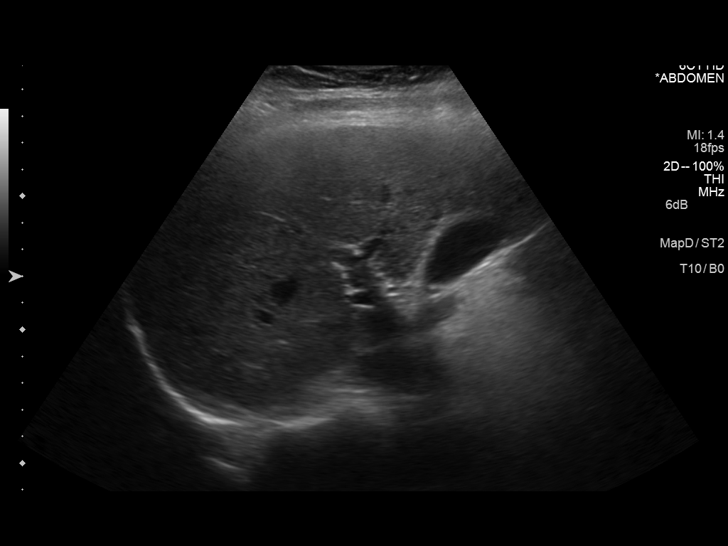
[im 17/45]
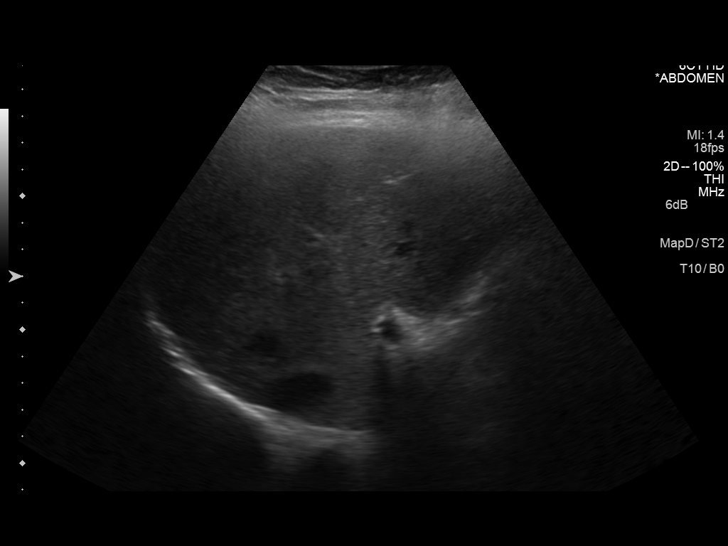
[im 21/45]
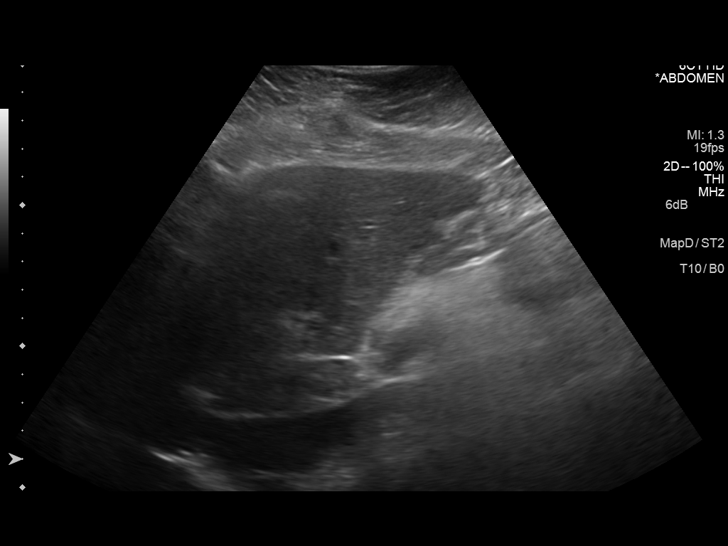
[im 24/45]
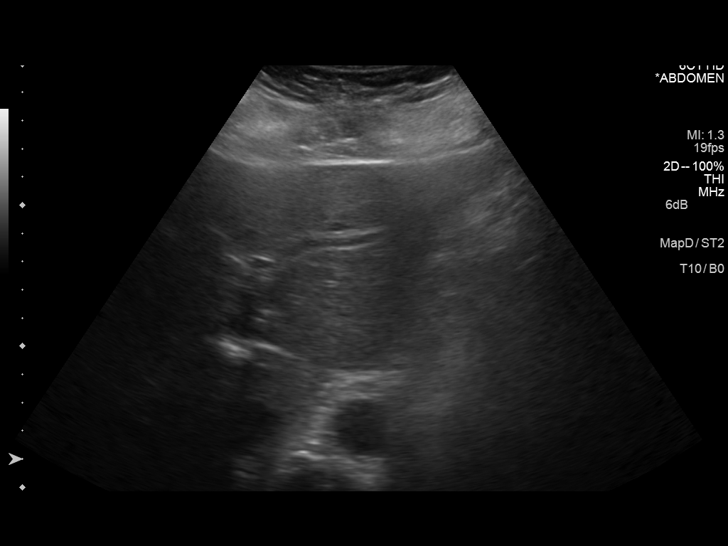
[im 28/45]
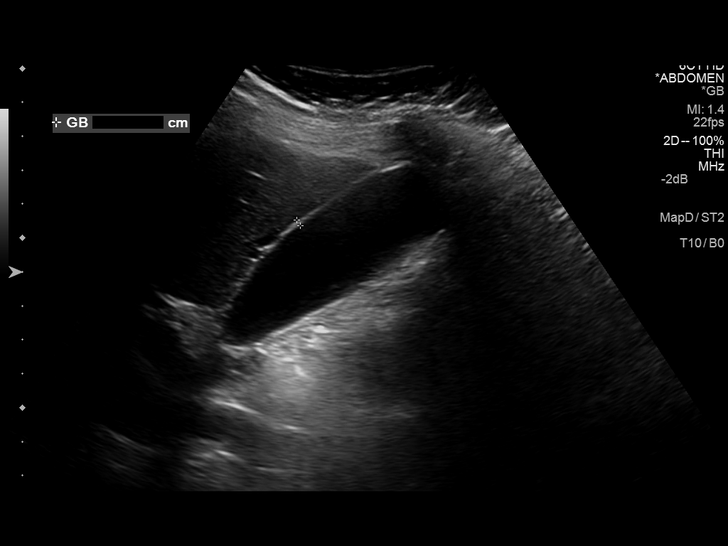
[im 30/45]
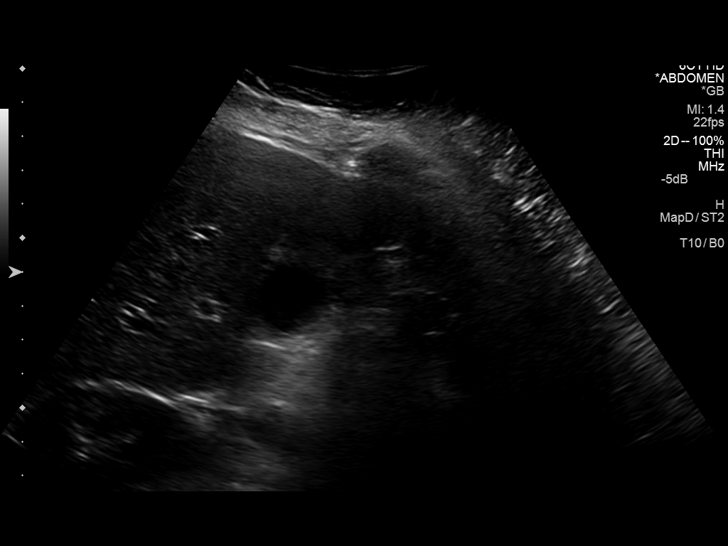
[im 34/45]
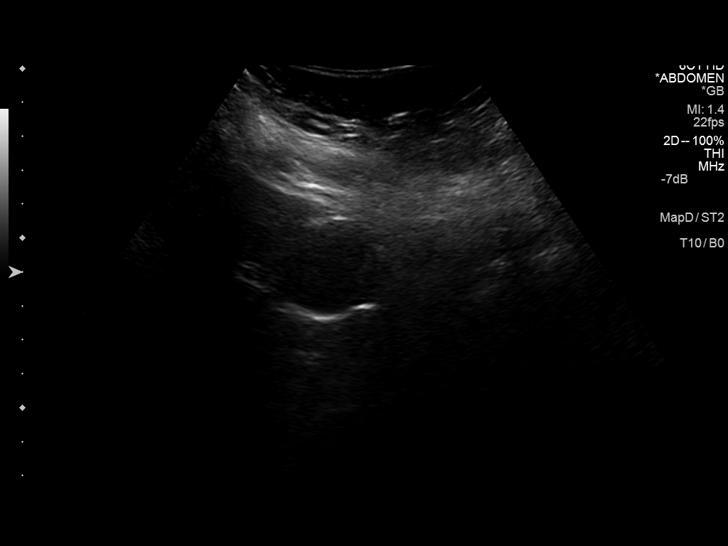
[im 37/45]
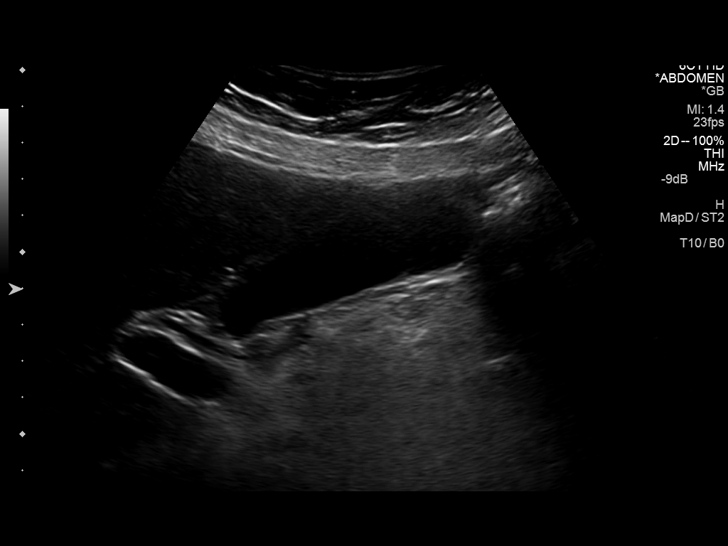
[im 41/45]
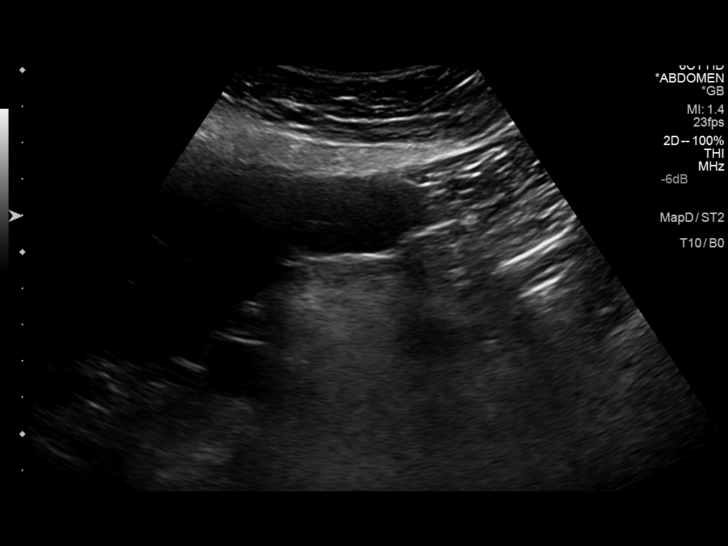
[im 45/45]
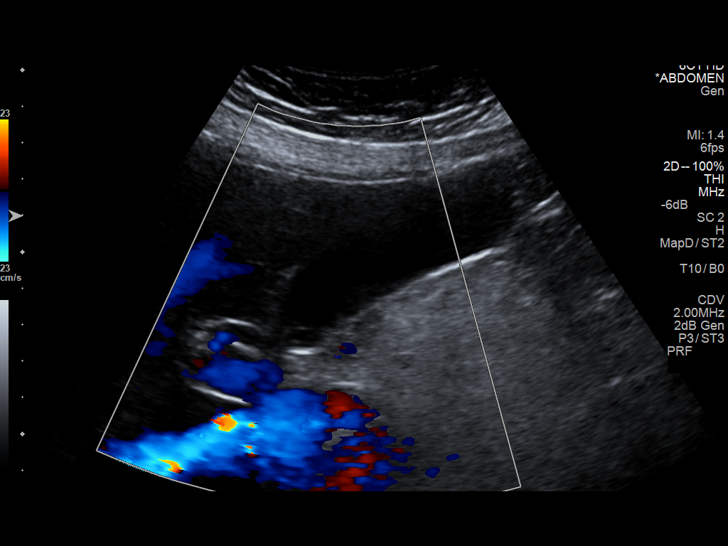

[14 of 25 positions shown; findings below may reference images not displayed]

FINDINGS: Gallbladder:

No gallstones or wall thickening visualized. No sonographic Murphy
sign noted.

Common bile duct:

Diameter: 5.1 mm

Liver:

No focal lesion identified. Within normal limits in parenchymal
echogenicity.
IMPRESSION: Normal right upper quadrant ultrasound.

## 2015-07-26 ENCOUNTER — Encounter: Payer: Self-pay | Admitting: Internal Medicine

## 2015-07-26 ENCOUNTER — Ambulatory Visit (INDEPENDENT_AMBULATORY_CARE_PROVIDER_SITE_OTHER): Payer: BLUE CROSS/BLUE SHIELD | Admitting: Internal Medicine

## 2015-07-26 VITALS — BP 132/80 | HR 74 | Temp 99.1°F | Resp 16 | Ht 71.0 in | Wt 210.0 lb

## 2015-07-26 DIAGNOSIS — J018 Other acute sinusitis: Secondary | ICD-10-CM | POA: Diagnosis not present

## 2015-07-26 MED ORDER — AMOXICILLIN-POT CLAVULANATE 875-125 MG PO TABS: 1.0000 | ORAL_TABLET | Freq: Two times a day (BID) | ORAL | Status: DC

## 2015-07-26 NOTE — Progress Notes (Signed)
    Date:  07/26/2015   Name:  Brandi Nguyen   DOB:  02-Nov-1955   MRN:  [id number]   Chief Complaint: Sinus Problem Sinus Problem This is a new problem. The current episode started in the past 7 days. The pain is mild. Associated symptoms include congestion, coughing, ear pain and a hoarse voice. Pertinent negatives include no chills or headaches. Past treatments include oral decongestants. The treatment provided moderate relief.     Review of Systems  Constitutional: Positive for fever. Negative for chills.  HENT: Positive for congestion, ear pain and hoarse voice. Negative for tinnitus, trouble swallowing and voice change.   Eyes: Negative for visual disturbance.  Respiratory: Positive for cough.   Neurological: Negative for dizziness and headaches.    Patient Active Problem List   Diagnosis Date Noted  . Hx of melanoma excision 04/14/2015  . Dyslipidemia 11/17/2014  . Depression, major, single episode, in partial remission ([place]) 11/17/2014  . Hot flash, menopausal 11/17/2014  . Leg pain 11/17/2014  . Bundle branch block, right 09/05/2013    Prior to Admission medications   Medication Sig Start Date End Date Taking? Authorizing Provider  buPROPion (WELLBUTRIN XL) 150 MG 24 hr tablet Take 3 tablets (450 mg total) by mouth daily. Patient taking differently: Take 300 mg by mouth daily.  02/08/15  Yes Glean Hess, MD  OMEGA-3 FATTY ACIDS PO Take 1 capsule by mouth daily.   Yes Historical Provider, MD    Allergies  Allergen Reactions  . Pregabalin Other (See Comments)    Causes aches and stiffness    Past Surgical History  Procedure Laterality Date  . Melanoma excision  2015  . Tubal ligation    . Colonoscopy  2014  . Dilation and curettage of uterus  2013    for post-menopausal bleeding    Social History  Substance Use Topics  . Smoking status: Former Research scientist (life sciences)  . Smokeless tobacco: None  . Alcohol Use: 1.2 oz/week    2 Standard drinks or equivalent per week       Medication list has been reviewed and updated.   Physical Exam  Constitutional: She is oriented to person, place, and time. She appears well-developed and well-nourished.  HENT:  Right Ear: External ear and ear canal normal. Tympanic membrane is not erythematous and not retracted.  Left Ear: External ear and ear canal normal. Tympanic membrane is not erythematous and not retracted.  Nose: Right sinus exhibits maxillary sinus tenderness and frontal sinus tenderness. Left sinus exhibits maxillary sinus tenderness and frontal sinus tenderness.  Mouth/Throat: Uvula is midline and mucous membranes are normal. No oral lesions. Posterior oropharyngeal erythema present. No oropharyngeal exudate.  Cardiovascular: Normal rate, regular rhythm and normal heart sounds.   Pulmonary/Chest: Breath sounds normal. She has no wheezes. She has no rales.  Lymphadenopathy:    She has no cervical adenopathy.  Neurological: She is alert and oriented to person, place, and time.    BP 132/80 mmHg  Pulse 74  Temp(Src) 99.1 F (37.3 C)  Resp 16  Ht 5\' 11"  (1.803 m)  Wt 210 lb (95.255 kg)  BMI 29.30 kg/m2  SpO2 99%  Assessment and Plan: 1. Other acute sinusitis Continue tylenol sinus bid Increase fluids - amoxicillin-clavulanate (AUGMENTIN) 875-125 MG tablet; Take 1 tablet by mouth 2 (two) times daily.  Dispense: 20 tablet; Refill: 0   Halina Maidens, MD [facility]  07/26/2015

## 2015-07-26 NOTE — Patient Instructions (Signed)

## 2015-08-19 ENCOUNTER — Encounter: Payer: Self-pay | Admitting: *Deleted

## 2015-08-19 ENCOUNTER — Ambulatory Visit
Admission: EM | Admit: 2015-08-19 | Discharge: 2015-08-19 | Disposition: A | Discharge status: Home / Self Care | Payer: BLUE CROSS/BLUE SHIELD | Attending: Family Medicine | Admitting: Family Medicine

## 2015-08-19 DIAGNOSIS — R000 Tachycardia, unspecified: Secondary | ICD-10-CM | POA: Diagnosis not present

## 2015-08-19 HISTORY — DX: Depression, unspecified: F32.A

## 2015-08-19 HISTORY — DX: Major depressive disorder, single episode, unspecified: F32.9

## 2015-08-19 HISTORY — DX: Anxiety disorder, unspecified: F41.9

## 2015-08-19 NOTE — ED Provider Notes (Signed)
CSN: [id number]     Arrival date & time 08/19/15  1233 History   First MD Initiated Contact with Patient 08/19/15 1335    Nurses notes were reviewed. Chief Complaint  Patient presents with  . Tachycardia   She states she's had 3 or 4 episodes of tachycardia. She recently had a complete physical with Dr. Army Melia her PCP. She states that she told Dr. Army Melia about this who recommended that next time since it like this happened she go and be evaluated immediately. Turns out that she has had a stress test and echocardiogram by Dr. Judith Blonder about 2 years ago. She passed a stress test and apparently the echo was acceptable as well.  She states that she's not been evaluated for this. She was at the drugstore which she started feeling her heart race. She states that she checked her vital signs blood pressure was stable for heart rate was up to 1 4150. So she came here to be seen and evaluated. Since she's gotten here her heart is now back to normal she feels fine and she has no symptoms at all at this time.   Past history she's had a history depression skin cancer and tubal ligation. Maternal grandmother with colon cancer and mother with heart failure she is at her brother and mother who had atrial fib as well. She is allergic to pregabalin    (Consider location/radiation/quality/duration/timing/severity/associated sxs/prior Treatment) Patient is a 60 y.o. female presenting with palpitations. The history is provided by the patient. No language interpreter was used.  Palpitations Palpitations quality:  Fast Onset quality:  Sudden Timing:  Intermittent Progression:  Resolved Chronicity:  Recurrent Context: anxiety   Context: not appetite suppressants, not caffeine, not dehydration, not exercise, not hyperventilation and not illicit drugs   Relieved by:  Nothing Worsened by:  Nothing Ineffective treatments:  None tried Associated symptoms: chest pain, chest pressure and shortness of breath    Associated symptoms: no dizziness, no lower extremity edema and no syncope   Risk factors: no hx of PE and no hx of thyroid disease     Past Medical History  Diagnosis Date  . Cancer ([place])     skin ca  . Depression   . Anxiety    Past Surgical History  Procedure Laterality Date  . Melanoma excision  2015  . Tubal ligation    . Colonoscopy  2014  . Dilation and curettage of uterus  2013    for post-menopausal bleeding   Family History  Problem Relation Age of Onset  . Heart failure Mother   . Colon cancer Maternal Grandmother    Social History  Substance Use Topics  . Smoking status: Former Research scientist (life sciences)  . Smokeless tobacco: None  . Alcohol Use: 1.2 oz/week    2 Standard drinks or equivalent per week   OB History    No data available     Review of Systems  Respiratory: Positive for shortness of breath.   Cardiovascular: Positive for chest pain and palpitations. Negative for syncope.  Neurological: Negative for dizziness.    Allergies  Pregabalin  Home Medications   Prior to Admission medications   Medication Sig Start Date End Date Taking? Authorizing Provider  buPROPion (WELLBUTRIN XL) 150 MG 24 hr tablet Take 3 tablets (450 mg total) by mouth daily. Patient taking differently: Take 300 mg by mouth daily.  02/08/15  Yes Glean Hess, MD  OMEGA-3 FATTY ACIDS PO Take 1 capsule by mouth daily.   Yes Historical  Provider, MD  amoxicillin-clavulanate (AUGMENTIN) 875-125 MG tablet Take 1 tablet by mouth 2 (two) times daily. 07/26/15   Glean Hess, MD   Meds Ordered and Administered this Visit  Medications - No data to display  BP 133/75 mmHg  Pulse 97  Temp(Src) 97.5 F (36.4 C) (Oral)  Resp 18  Ht 6' (1.829 m)  Wt 200 lb (90.719 kg)  BMI 27.12 kg/m2  SpO2 97% No data found.   Physical Exam  Constitutional: She is oriented to person, place, and time. She appears well-developed and well-nourished.  HENT:  Head: Normocephalic and atraumatic.  Eyes:  Pupils are equal, round, and reactive to light.  Neck: Normal range of motion. Neck supple. No thyromegaly present.  Cardiovascular: Normal rate, regular rhythm and normal heart sounds.   Pulmonary/Chest: Effort normal and breath sounds normal.  Musculoskeletal: Normal range of motion.  Lymphadenopathy:    She has no cervical adenopathy.  Neurological: She is alert and oriented to person, place, and time.  Skin: Skin is warm.  Psychiatric: She has a normal mood and affect.  Vitals reviewed.   ED Course  Procedures (including critical care time)  Labs Review Labs Reviewed - No data to display  Imaging Review No results found.   Visual Acuity Review  Right Eye Distance:   Left Eye Distance:   Bilateral Distance:    Right Eye Near:   Left Eye Near:    Bilateral Near:         MDM   1. Tachycardia    Discussed with patient at this point time EKG is now 90. She states she feels fine she still longer have any shortness of breath discomfort or tachycardia. This is the third time not fourth time she's had this in the last few weeks. Explained to her we have 2 options one option would be to go to the ED have serial cardiac enzymes drawn lab work including possible TSH and then decision will be made whether to keep her overnight with these probably put 2502 monitor and send her home. The other option is to forego going to the emergency room and make appointment to see the doctor Army Melia or Dr. Tomasita Crumble show in the very near future for workup and evaluation. In the future if this happens again I would recommend going straight to the ED of her choice. Since she has a right bundle branch block present on EKG and was told she had before but has reassuring to her. However I cannot compare this EKG with any previous EKG we looked at the one she had and that one is unable to be pulled up at this time. So we'll sure how much change or if his been any change since her last EKG.   She states  she's not having any problems now and she wants to go home. Will acquiesce to her request.  She does understand that next time the tachycardia occurs going to the ED would be better in that even she came here she was tachycardic if it was deemed serious enough and we had started line on her to help control the tachycardia would have to shipper to the closest ED as well.     ED ECG REPORT I, Telesia Ates H, the attending physician, personally viewed and interpreted this ECG.   Date: 08/19/2015  EKG Time:12:54:24  Rate: 90  Rhythm: there are no previous tracings available for comparison, normal sinus rhythm, RBBB, possible lateral infarct age undetermined  Axis: 44  Intervals:none and right bundle branch block  ST&T Change: none  Note: This dictation was prepared with Dragon dictation along with smaller phrase technology. Any transcriptional errors that result from this process are unintentional.    Frederich Cha, MD 08/19/15 1421

## 2015-08-19 NOTE — ED Notes (Addendum)
Patient started having symptoms of tachycardia 30 minutes ago while in the drug store. The BP machine at drug store reported HR of 158 and a BP 143/85. Patient's heart rate is presently 100 bp 133/75. Patient does have a history of tachycardia.

## 2015-08-19 NOTE — Discharge Instructions (Signed)
Paroxysmal Supraventricular Tachycardia Paroxysmal supraventricular tachycardia (PSVT) is a type of abnormal heart rhythm. It causes your heart to beat very quickly and then suddenly stop beating so quickly. A normal heart rate is 60-100 beats per minute. During an episode of PSVT, your heart rate may be 150-250 beats per minute. This can make you feel light-headed and short of breath. An episode of PSVT can be frightening. It is usually not dangerous. The heart has four chambers. All chambers need to work together for the heart to beat effectively. A normal heartbeat usually starts in the right upper chamber of the heart (atrium) when an area (sinoatrial node) puts out an electrical signal that spreads to the other chambers. People with PSVT may have abnormal electrical pathways, or they may have other areas in the upper chambers that send out electrical signals. The result is a very rapid heartbeat. When your heart beats very quickly, it does not have time to fill completely with blood. When PSVT happens often or it lasts for long periods, it can lead to heart weakness and failure. Most people with PSVT do not have any other heart disease. CAUSES Abnormal electrical activity in the heart causes PSVT. It is not known why some people get PSVT and others do not. RISK FACTORS You may be more likely to have PSVT if:  You are 20-30 years old.  You are a woman. Other factors that may increase your chances of an attack include:  Stress.  Being tired.  Smoking.  Stimulant drugs.  Alcoholic drinks.  Caffeine.  Pregnancy. SIGNS AND SYMPTOMS A mild episode of PSVT may cause no symptoms. If you do have signs and symptoms, they may include:  A pounding heart.  Feeling of skipped heartbeats (palpitations).  Weakness.  Shortness of breath.  Tightness or pain in your chest.  Light-headedness.  Anxiety.  Dizziness.  Sweating.  Nausea.  A fainting spell. DIAGNOSIS Your health care  provider may suspect PSVT if you have symptoms that come and go. The health care provider will do a physical exam. If you are having an episode during the exam, the health care provider may be able to diagnose PSVT by listening to your heart and feeling your pulse. Tests may also be done, including:  An electrical study of your heart (electrocardiogram, or ECG).  A test in which you wear a portable ECG monitor all day (Holter monitor) or for several days (event monitor).  A test that involves taking an image of your heart using sound waves (echocardiogram) to rule out other causes of a fast heart rate. TREATMENT You may not need treatment if episodes of PSVT do not happen often or if they do not cause symptoms. If PSVT episodes do cause symptoms, your health care provider may first suggest trying a self-treatment called vagus nerve stimulation. The vagus nerve extends down from the brain. It regulates certain body functions. Stimulating this nerve can slow down the heart. Your health care provider can teach you ways to do this. You may need to try a few ways to find what works best for you. Options include:  Holding your breath and pushing, as though you are having a bowel movement.  Massaging an area on one side of your neck below your jaw.  Bending forward with your head between your legs.  Bending forward with your head between your legs and coughing.  Massaging your eyeballs with your eyes closed. If vagus nerve stimulation does not work, other treatment options include:    Medicines to prevent an attack.  Being treated in the hospital with medicine or electric shock to stop an attack (cardioversion). This treatment can include:  Getting medicine through an IV line.  Having a small electric shock delivered to your heart. You will be given medicine to make you sleep through this procedure.  If you have frequent episodes with symptoms, you may need a procedure to get rid of the faulty  areas of your heart (radiofrequency ablation) and end the episodes of PSVT. In this procedure:  A long, thin tube (catheter) is passed through one of your veins into your heart.  Energy directed through the catheter eliminates the areas of your heart that are causing abnormal electric stimulation. HOME CARE INSTRUCTIONS  Take medicines only as directed by your health care provider.  Do not use caffeine in any form if caffeine triggers episodes of PSVT. Otherwise, consume caffeine in moderation. This means no more than a few cups of coffee or the equivalent each day.  Do not drink alcohol if alcohol triggers episodes of PSVT. Otherwise, limit alcohol intake to no more than 1 drink per day for nonpregnant women and 2 drinks per day for men. One drink equals 12 ounces of beer, 5 ounces of wine, or 1 ounces of hard liquor.  Do not use any tobacco products, including cigarettes, chewing tobacco, or electronic cigarettes. If you need help quitting, ask your health care provider.  Try to get at least 7 hours of sleep each night.  Find healthy ways to manage stress.  Perform vagus nerve stimulation as directed by your health care provider.  Maintain a healthy weight.  Get some exercise on most days. Ask your health care provider to suggest some good activities for you. SEEK MEDICAL CARE IF:  You are having episodes of PSVT more often, or they are lasting longer.  Vagus nerve stimulation is no longer helping.  You have new symptoms during an episode. SEEK IMMEDIATE MEDICAL CARE IF:  You have chest pain or trouble breathing.  You have an episode of PSVT that has lasted longer than 20 minutes.  You have passed out from an episode of PSVT. These symptoms may represent a serious problem that is an emergency. Do not wait to see if the symptoms will go away. Get medical help right away. Call your local emergency services (911 in the [place] Do not drive yourself to the hospital.   This  information is not intended to replace advice given to you by your health care provider. Make sure you discuss any questions you have with your health care provider.   Document Released: 02/06/2005 Document Revised: 02/27/2014 Document Reviewed: 07/17/2013 Elsevier Interactive Patient Education 2016 [employer]  

## 2015-12-10 HISTORY — PX: MOHS SURGERY: SHX181

## 2015-12-28 ENCOUNTER — Encounter: Payer: Self-pay | Admitting: Internal Medicine

## 2015-12-28 ENCOUNTER — Ambulatory Visit (INDEPENDENT_AMBULATORY_CARE_PROVIDER_SITE_OTHER): Payer: BLUE CROSS/BLUE SHIELD | Admitting: Internal Medicine

## 2015-12-28 VITALS — BP 122/80 | HR 68 | Ht 72.0 in | Wt 220.0 lb

## 2015-12-28 DIAGNOSIS — J0100 Acute maxillary sinusitis, unspecified: Secondary | ICD-10-CM | POA: Diagnosis not present

## 2015-12-28 DIAGNOSIS — F324 Major depressive disorder, single episode, in partial remission: Secondary | ICD-10-CM | POA: Diagnosis not present

## 2015-12-28 MED ORDER — BUPROPION HCL ER (XL) 150 MG PO TB24: 450.0000 mg | ORAL_TABLET | Freq: Every day | ORAL | 1 refills | Status: DC

## 2015-12-28 MED ORDER — AMOXICILLIN 875 MG PO TABS: 875.0000 mg | ORAL_TABLET | Freq: Two times a day (BID) | ORAL | 0 refills | Status: DC

## 2015-12-28 NOTE — Progress Notes (Signed)
Date:  12/28/2015   Name:  Brandi Nguyen   DOB:  1955-07-06   MRN:  [id number]   Chief Complaint: Sinusitis (stuffy nose with drainage and cough. Mucinex is not helping. ) Sinusitis  This is a new problem. The current episode started 1 to 4 weeks ago (started like a cold virus but will not resolve). The problem has been waxing and waning since onset. There has been no fever. She is experiencing no pain. Associated symptoms include congestion and sinus pressure. Pertinent negatives include no coughing, diaphoresis, ear pain, headaches or sore throat. Treatments tried: mucinex. The treatment provided mild relief.  Depression         This is a chronic problem.  The problem has been resolved since onset.  Associated symptoms include no headaches.  Past treatments include other medications.  Compliance with treatment is good.  Previous treatment provided significant relief.     Review of Systems  Constitutional: Negative for diaphoresis.  HENT: Positive for congestion and sinus pressure. Negative for ear pain, sore throat, tinnitus and trouble swallowing.   Respiratory: Negative for cough, chest tightness and wheezing.   Cardiovascular: Negative for chest pain.  Neurological: Negative for dizziness and headaches.  Psychiatric/Behavioral: Positive for depression.    Patient Active Problem List   Diagnosis Date Noted  . Hx of melanoma excision 04/14/2015  . Dyslipidemia 11/17/2014  . Depression, major, single episode, in partial remission (Brandi Nguyen) 11/17/2014  . Hot flash, menopausal 11/17/2014  . Leg pain 11/17/2014  . Bundle branch block, right 09/05/2013    Prior to Admission medications   Medication Sig Start Date End Date Taking? Authorizing Provider  atorvastatin (LIPITOR) 40 MG tablet Take 40 mg by mouth daily. Dr. Michaelle Birks   Yes Historical Provider, MD  buPROPion (WELLBUTRIN XL) 150 MG 24 hr tablet Take 3 tablets (450 mg total) by mouth daily. Patient taking differently: Take  300 mg by mouth daily.  02/08/15  Yes Glean Hess, MD  OMEGA-3 FATTY ACIDS PO Take 1 capsule by mouth daily.   Yes Historical Provider, MD  amoxicillin-clavulanate (AUGMENTIN) 875-125 MG tablet Take 1 tablet by mouth 2 (two) times daily. Patient not taking: Reported on 12/28/2015 07/26/15   Glean Hess, MD    Allergies  Allergen Reactions  . Pregabalin Other (See Comments)    Causes aches and stiffness    Past Surgical History:  Procedure Laterality Date  . COLONOSCOPY  2014  . DILATION AND CURETTAGE OF UTERUS  2013   for post-menopausal bleeding  . MELANOMA EXCISION  2015  . MOHS SURGERY  12/10/2015   removed from hand  . TUBAL LIGATION      Social History  Substance Use Topics  . Smoking status: Former Research scientist (life sciences)  . Smokeless tobacco: Never Used  . Alcohol use 1.2 oz/week    2 Standard drinks or equivalent per week     Medication list has been reviewed and updated.   Physical Exam  Constitutional: She is oriented to person, place, and time. She appears well-developed and well-nourished.  HENT:  Right Ear: Tympanic membrane, external ear and ear canal normal. Tympanic membrane is not erythematous and not retracted.  Left Ear: Tympanic membrane, external ear and ear canal normal. Tympanic membrane is not erythematous and not retracted.  Nose: Right sinus exhibits maxillary sinus tenderness. Right sinus exhibits no frontal sinus tenderness. Left sinus exhibits maxillary sinus tenderness. Left sinus exhibits no frontal sinus tenderness.  Mouth/Throat: Uvula is midline and mucous  membranes are normal. No oral lesions. Posterior oropharyngeal erythema present. No oropharyngeal exudate.  Cardiovascular: Normal rate, regular rhythm and normal heart sounds.   Pulmonary/Chest: Breath sounds normal. She has no wheezes. She has no rales.  Lymphadenopathy:    She has no cervical adenopathy.  Neurological: She is alert and oriented to person, place, and time.    BP 122/80    Pulse 68   Ht 6' (1.829 m)   Wt 220 lb (99.8 kg)   BMI 29.84 kg/m   Assessment and Plan: 1. Acute maxillary sinusitis, recurrence not specified Use saline nasal spray for dry nares Allegra 180 mg for PND - amoxicillin (AMOXIL) 875 MG tablet; Take 1 tablet (875 mg total) by mouth 2 (two) times daily.  Dispense: 20 tablet; Refill: 0  2. Depression, major, single episode, in partial remission (Brandi Nguyen) Doing well - will refill - buPROPion (WELLBUTRIN XL) 150 MG 24 hr tablet; Take 3 tablets (450 mg total) by mouth daily.  Dispense: 270 tablet; Refill: [place], MD [facility]  12/28/2015

## 2016-02-01 HISTORY — PX: COLONOSCOPY: SHX5424

## 2016-02-02 ENCOUNTER — Encounter: Payer: Self-pay | Admitting: Internal Medicine

## 2016-04-14 ENCOUNTER — Other Ambulatory Visit: Payer: Self-pay | Admitting: Internal Medicine

## 2016-04-14 ENCOUNTER — Encounter: Payer: Self-pay | Admitting: Internal Medicine

## 2016-04-14 ENCOUNTER — Ambulatory Visit (INDEPENDENT_AMBULATORY_CARE_PROVIDER_SITE_OTHER): Payer: BLUE CROSS/BLUE SHIELD | Admitting: Internal Medicine

## 2016-04-14 VITALS — BP 108/72 | HR 68 | Ht 72.0 in | Wt 220.0 lb

## 2016-04-14 DIAGNOSIS — Z0000 Encounter for general adult medical examination without abnormal findings: Secondary | ICD-10-CM | POA: Diagnosis not present

## 2016-04-14 DIAGNOSIS — L247 Irritant contact dermatitis due to plants, except food: Secondary | ICD-10-CM

## 2016-04-14 DIAGNOSIS — Z1231 Encounter for screening mammogram for malignant neoplasm of breast: Secondary | ICD-10-CM

## 2016-04-14 DIAGNOSIS — E785 Hyperlipidemia, unspecified: Secondary | ICD-10-CM

## 2016-04-14 DIAGNOSIS — F324 Major depressive disorder, single episode, in partial remission: Secondary | ICD-10-CM | POA: Diagnosis not present

## 2016-04-14 DIAGNOSIS — Z1239 Encounter for other screening for malignant neoplasm of breast: Secondary | ICD-10-CM

## 2016-04-14 LAB — POCT URINALYSIS DIPSTICK
BILIRUBIN UA: NEGATIVE
GLUCOSE UA: NEGATIVE
KETONES UA: NEGATIVE
Nitrite, UA: NEGATIVE
RBC UA: NEGATIVE
SPEC GRAV UA: 1.01
UROBILINOGEN UA: 0.2
pH, UA: 8

## 2016-04-14 MED ORDER — TRIAMCINOLONE ACETONIDE 0.1 % EX CREA: 1.0000 "application " | TOPICAL_CREAM | Freq: Two times a day (BID) | CUTANEOUS | 0 refills | Status: DC

## 2016-04-14 NOTE — Patient Instructions (Signed)
Breast Self-Awareness Introduction Breast self-awareness means being familiar with how your breasts look and feel. It involves checking your breasts regularly and reporting any changes to your health care provider. Practicing breast self-awareness is important. A change in your breasts can be a sign of a serious medical problem. Being familiar with how your breasts look and feel allows you to find any problems early, when treatment is more likely to be successful. All women should practice breast self-awareness, including women who have had breast implants. How to do a breast self-exam One way to learn what is normal for your breasts and whether your breasts are changing is to do a breast self-exam. To do a breast self-exam: Look for Changes  1. Remove all the clothing above your waist. 2. Stand in front of a mirror in a room with good lighting. 3. Put your hands on your hips. 4. Push your hands firmly downward. 5. Compare your breasts in the mirror. Look for differences between them (asymmetry), such as:  Differences in shape.  Differences in size.  Puckers, dips, and bumps in one breast and not the other. 6. Look at each breast for changes in your skin, such as:  Redness.  Scaly areas. 7. Look for changes in your nipples, such as:  Discharge.  Bleeding.  Dimpling.  Redness.  A change in position. Feel for Changes  Carefully feel your breasts for lumps and changes. It is best to do this while lying on your back on the floor and again while sitting or standing in the shower or tub with soapy water on your skin. Feel each breast in the following way:  Place the arm on the side of the breast you are examining above your head.  Feel your breast with the other hand.  Start in the nipple area and make  inch (2 cm) overlapping circles to feel your breast. Use the pads of your three middle fingers to do this. Apply light pressure, then medium pressure, then firm pressure. The light  pressure will allow you to feel the tissue closest to the skin. The medium pressure will allow you to feel the tissue that is a little deeper. The firm pressure will allow you to feel the tissue close to the ribs.  Continue the overlapping circles, moving downward over the breast until you feel your ribs below your breast.  Move one finger-width toward the center of the body. Continue to use the  inch (2 cm) overlapping circles to feel your breast as you move slowly up toward your collarbone.  Continue the up and down exam using all three pressures until you reach your armpit. Write Down What You Find  Write down what is normal for each breast and any changes that you find. Keep a written record with breast changes or normal findings for each breast. By writing this information down, you do not need to depend only on memory for size, tenderness, or location. Write down where you are in your menstrual cycle, if you are still menstruating. If you are having trouble noticing differences in your breasts, do not get discouraged. With time you will become more familiar with the variations in your breasts and more comfortable with the exam. How often should I examine my breasts? Examine your breasts every month. If you are breastfeeding, the best time to examine your breasts is after a feeding or after using a breast pump. If you menstruate, the best time to examine your breasts is 5-7 days after your  period is over. During your period, your breasts are lumpier, and it may be more difficult to notice changes. When should I see my health care provider? See your health care provider if you notice:  A change in shape or size of your breasts or nipples.  A change in the skin of your breast or nipples, such as a reddened or scaly area.  Unusual discharge from your nipples.  A lump or thick area that was not there before.  Pain in your breasts.  Anything that concerns you. This information is not  intended to replace advice given to you by your health care provider. Make sure you discuss any questions you have with your health care provider. Document Released: 02/06/2005 Document Revised: 07/15/2015 Document Reviewed: 12/27/2014  2017 Elsevier

## 2016-04-14 NOTE — Progress Notes (Signed)
Date:  04/14/2016   Name:  Brandi Nguyen   DOB:  01-05-1956   MRN:  [id number]   Chief Complaint: Annual Exam (w no pap.) [name] is a 61 y.o. female who presents today for her Complete Annual Exam. She feels well. She reports exercising on treadmill. She reports she is sleeping fairly well. She denies any breast problems.  She recently has some palpitations but 30 day monitor was normal. She is still being evaluated for kidney donation to her nephew.  Hyperlipidemia  This is a chronic problem. The problem is controlled. Recent lipid tests were reviewed and are normal. Pertinent negatives include no chest pain or shortness of breath. Current antihyperlipidemic treatment includes statins.  Atorvastatin added after cardiology visit for palpitations.  She has no side effects but has not had a recheck.  Depression - doing well on current therapy with bupropion.  No new problems.  Review of Systems  Constitutional: Negative for chills, fatigue and fever.  HENT: Negative for congestion, hearing loss, tinnitus, trouble swallowing and voice change.   Eyes: Negative for visual disturbance.  Respiratory: Negative for cough, chest tightness, shortness of breath and wheezing.   Cardiovascular: Positive for palpitations. Negative for chest pain and leg swelling.  Gastrointestinal: Negative for abdominal pain, constipation, diarrhea and vomiting.  Endocrine: Negative for polydipsia and polyuria.  Genitourinary: Negative for dysuria, frequency, genital sores, vaginal bleeding and vaginal discharge.  Musculoskeletal: Positive for arthralgias (left shoulder). Negative for gait problem and joint swelling.  Skin: Positive for rash. Negative for color change.  Neurological: Negative for dizziness, tremors, light-headedness and headaches.  Hematological: Negative for adenopathy. Does not bruise/bleed easily.  Psychiatric/Behavioral: Negative for dysphoric mood and sleep disturbance. The patient  is not nervous/anxious.     Patient Active Problem List   Diagnosis Date Noted  . Hx of melanoma excision 04/14/2015  . Dyslipidemia 11/17/2014  . Depression, major, single episode, in partial remission ([place]) 11/17/2014  . Hot flash, menopausal 11/17/2014  . Leg pain 11/17/2014  . Bundle branch block, right 09/05/2013    Prior to Admission medications   Medication Sig Start Date End Date Taking? Authorizing Provider  atorvastatin (LIPITOR) 40 MG tablet Take 40 mg by mouth daily. Dr. Michaelle Birks   Yes Historical Provider, MD  buPROPion (WELLBUTRIN XL) 150 MG 24 hr tablet Take 3 tablets (450 mg total) by mouth daily. 12/28/15  Yes Glean Hess, MD  OMEGA-3 FATTY ACIDS PO Take 1 capsule by mouth daily.   Yes Historical Provider, MD    Allergies  Allergen Reactions  . Pregabalin Other (See Comments)    Causes aches and stiffness    Past Surgical History:  Procedure Laterality Date  . COLONOSCOPY  2014  . COLONOSCOPY  02/01/2016   one polyp  . DILATION AND CURETTAGE OF UTERUS  2013   for post-menopausal bleeding  . MELANOMA EXCISION  2015  . MOHS SURGERY  12/10/2015   removed from hand  . TUBAL LIGATION      Social History  Substance Use Topics  . Smoking status: Former Research scientist (life sciences)  . Smokeless tobacco: Never Used  . Alcohol use 1.2 oz/week    2 Standard drinks or equivalent per week   Depression screen [facility] 2/9 04/14/2016 04/14/2015  Decreased Interest 0 0  Down, Depressed, Hopeless 0 0  PHQ - 2 Score 0 0  Altered sleeping 1 -  Tired, decreased energy 0 -  Change in appetite 0 -  Feeling bad or  failure about yourself  0 -  Trouble concentrating 0 -  Moving slowly or fidgety/restless 0 -  Suicidal thoughts 0 -  PHQ-9 Score 1 -     Medication list has been reviewed and updated.   Physical Exam  Constitutional: She is oriented to person, place, and time. She appears well-developed and well-nourished. No distress.  HENT:  Head: Normocephalic and atraumatic.  Right  Ear: Tympanic membrane and ear canal normal.  Left Ear: Tympanic membrane and ear canal normal.  Nose: Right sinus exhibits no maxillary sinus tenderness. Left sinus exhibits no maxillary sinus tenderness.  Mouth/Throat: Uvula is midline and oropharynx is clear and moist.  Eyes: Conjunctivae and EOM are normal. Right eye exhibits no discharge. Left eye exhibits no discharge. No scleral icterus.  Neck: Normal range of motion. Carotid bruit is not present. No erythema present. No thyromegaly present.  Cardiovascular: Normal rate, regular rhythm, normal heart sounds and normal pulses.   Pulmonary/Chest: Effort normal. No respiratory distress. She has no wheezes. Right breast exhibits no mass, no nipple discharge, no skin change and no tenderness. Left breast exhibits no mass, no nipple discharge, no skin change and no tenderness.  Abdominal: Soft. Bowel sounds are normal. There is no hepatosplenomegaly. There is no tenderness. There is no CVA tenderness.  Musculoskeletal:       Left shoulder: She exhibits decreased range of motion and tenderness.  Lymphadenopathy:    She has no cervical adenopathy.    She has no axillary adenopathy.  Neurological: She is alert and oriented to person, place, and time. She has normal reflexes. No cranial nerve deficit or sensory deficit.  Skin: Skin is warm, dry and intact. No rash noted.  Scattered vesicles on arms and breast c/w contact dermatitis  Psychiatric: She has a normal mood and affect. Her speech is normal and behavior is normal. Thought content normal.  Nursing note and vitals reviewed.   BP 108/72   Pulse 68   Ht 6' (1.829 m)   Wt 220 lb (99.8 kg)   SpO2 98%   BMI 29.84 kg/m   Assessment and Plan: 1. Annual physical exam Continue diet and exercise - CBC with Differential/Platelet - POCT urinalysis dipstick  2. Breast cancer screening - MM DIGITAL SCREENING BILATERAL; Future  3. Depression, major, single episode, in partial remission  ([place]) Doing well - TSH  4. Dyslipidemia Now on lipitor Will check labs and consider lower dose - Comprehensive metabolic panel - Lipid panel  5. Irritant contact dermatitis due to plants, except food TAC cream prn   Meds ordered this encounter  Medications  . triamcinolone cream (KENALOG) 0.1 %    Sig: Apply 1 application topically 2 (two) times daily.    Dispense:  30 g    Refill:  0    Halina Maidens, MD [facility]  04/14/2016

## 2016-04-15 LAB — LIPID PANEL
Chol/HDL Ratio: 2.7 ratio units (ref 0.0–4.4)
Cholesterol, Total: 164 mg/dL (ref 100–199)
HDL: 61 mg/dL (ref >39)
LDL CALC: 88 mg/dL (ref 0–99)
TRIGLYCERIDES: 77 mg/dL (ref 0–149)
VLDL Cholesterol Cal: 15 mg/dL (ref 5–40)

## 2016-04-15 LAB — CBC WITH DIFFERENTIAL/PLATELET
BASOS ABS: 0 10*3/uL (ref 0.0–0.2)
Basos: 0 %
EOS (ABSOLUTE): 0.1 10*3/uL (ref 0.0–0.4)
Eos: 2 %
HEMATOCRIT: 39.2 % (ref 34.0–46.6)
Hemoglobin: 12.8 g/dL (ref 11.1–15.9)
IMMATURE GRANULOCYTES: 0 %
Immature Grans (Abs): 0 10*3/uL (ref 0.0–0.1)
Lymphocytes Absolute: 1.8 10*3/uL (ref 0.7–3.1)
Lymphs: 40 %
MCH: 29 pg (ref 26.6–33.0)
MCHC: 32.7 g/dL (ref 31.5–35.7)
MCV: 89 fL (ref 79–97)
MONOS ABS: 0.4 10*3/uL (ref 0.1–0.9)
Monocytes: 8 %
NEUTROS PCT: 50 %
Neutrophils Absolute: 2.3 10*3/uL (ref 1.4–7.0)
PLATELETS: 230 10*3/uL (ref 150–379)
RBC: 4.41 x10E6/uL (ref 3.77–5.28)
RDW: 13.8 % (ref 12.3–15.4)
WBC: 4.6 10*3/uL (ref 3.4–10.8)

## 2016-04-15 LAB — COMPREHENSIVE METABOLIC PANEL
ALT: 40 IU/L — ABNORMAL HIGH (ref 0–32)
AST: 31 IU/L (ref 0–40)
Albumin/Globulin Ratio: 2.1 (ref 1.2–2.2)
Albumin: 4.5 g/dL (ref 3.5–5.5)
Alkaline Phosphatase: 113 IU/L (ref 39–117)
BUN/Creatinine Ratio: 21 (ref 9–23)
BUN: 16 mg/dL (ref 6–24)
Bilirubin Total: 0.6 mg/dL (ref 0.0–1.2)
CALCIUM: 9.3 mg/dL (ref 8.7–10.2)
CO2: 27 mmol/L (ref 18–29)
CREATININE: 0.75 mg/dL (ref 0.57–1.00)
Chloride: 103 mmol/L (ref 96–106)
GFR calc Af Amer: 102 mL/min/{1.73_m2} (ref >59)
GFR, EST NON AFRICAN AMERICAN: 88 mL/min/{1.73_m2} (ref >59)
GLOBULIN, TOTAL: 2.1 g/dL (ref 1.5–4.5)
Glucose: 84 mg/dL (ref 65–99)
POTASSIUM: 4.2 mmol/L (ref 3.5–5.2)
SODIUM: 146 mmol/L — AB (ref 134–144)
Total Protein: 6.6 g/dL (ref 6.0–8.5)

## 2016-04-15 LAB — TSH: TSH: 2.64 u[IU]/mL (ref 0.450–4.500)

## 2016-05-16 DIAGNOSIS — M7542 Impingement syndrome of left shoulder: Secondary | ICD-10-CM | POA: Insufficient documentation

## 2016-05-29 ENCOUNTER — Ambulatory Visit
Admission: RE | Admit: 2016-05-29 | Discharge: 2016-05-29 | Disposition: A | Discharge status: Home / Self Care | Payer: BLUE CROSS/BLUE SHIELD | Source: Ambulatory Visit | Attending: Internal Medicine | Admitting: Internal Medicine

## 2016-05-29 DIAGNOSIS — Z1231 Encounter for screening mammogram for malignant neoplasm of breast: Secondary | ICD-10-CM | POA: Diagnosis present

## 2016-05-29 DIAGNOSIS — Z1239 Encounter for other screening for malignant neoplasm of breast: Secondary | ICD-10-CM

## 2016-07-21 ENCOUNTER — Encounter: Payer: Self-pay | Admitting: Emergency Medicine

## 2016-07-21 ENCOUNTER — Ambulatory Visit
Admission: EM | Admit: 2016-07-21 | Discharge: 2016-07-21 | Disposition: A | Discharge status: Home / Self Care | Payer: BLUE CROSS/BLUE SHIELD | Attending: Internal Medicine | Admitting: Internal Medicine

## 2016-07-21 DIAGNOSIS — R319 Hematuria, unspecified: Secondary | ICD-10-CM | POA: Diagnosis not present

## 2016-07-21 DIAGNOSIS — N390 Urinary tract infection, site not specified: Secondary | ICD-10-CM

## 2016-07-21 DIAGNOSIS — B029 Zoster without complications: Secondary | ICD-10-CM

## 2016-07-21 MED ORDER — PREDNISONE 50 MG PO TABS: 50.0000 mg | ORAL_TABLET | Freq: Every day | ORAL | 0 refills | Status: DC

## 2016-07-21 MED ORDER — HYDROCODONE-ACETAMINOPHEN 5-325 MG PO TABS: 1.0000 | ORAL_TABLET | Freq: Four times a day (QID) | ORAL | 0 refills | Status: DC | PRN

## 2016-07-21 MED ORDER — VALACYCLOVIR HCL 1 G PO TABS: 1000.0000 mg | ORAL_TABLET | Freq: Three times a day (TID) | ORAL | 0 refills | Status: AC

## 2016-07-21 NOTE — ED Triage Notes (Signed)
Patient c/o tender rash on the right side of her rib cage for the past 2 weeks.

## 2016-07-21 NOTE — Discharge Instructions (Addendum)
Anticipate gradual improvement in rash and pain over the next 2-3 weeks.  Rash should be crusting and drying over the next several days.  Once rash is crusted you are not considered contagious.  Consult with grandbabies' pediatrician or daughters' OB about risk of transfering infection; risk is markedly reduced if daughters have had chicken pox.

## 2016-07-21 NOTE — ED Provider Notes (Signed)
[facility]    CSN: [id number] Arrival date & time: 07/21/16  4193     History   Chief Complaint Chief Complaint  Patient presents with  . Rash    HPI Brandi Nguyen is a 61 y.o. female. She presents today with soreness and discomfort in her right rib cage, front and back, for the last couple weeks. In the last 2 or 3 days, some red bumps erupted, there are very sore. She is concerned about shingles. Both of her daughters are pregnant, due anytime now.  They have both had chickenpox. Patient also reports headache, night before last, and some fatigue.    HPI  Past Medical History:  Diagnosis Date  . Anxiety   . Cancer ([place])    skin ca  . Depression     Patient Active Problem List   Diagnosis Date Noted  . Hx of melanoma excision 04/14/2015  . Dyslipidemia 11/17/2014  . Depression, major, single episode, in partial remission ([place]) 11/17/2014  . Hot flash, menopausal 11/17/2014  . Leg pain 11/17/2014  . Bundle branch block, right 09/05/2013    Past Surgical History:  Procedure Laterality Date  . COLONOSCOPY  2014  . COLONOSCOPY  02/01/2016   one polyp  . DILATION AND CURETTAGE OF UTERUS  2013   for post-menopausal bleeding  . MELANOMA EXCISION  2015  . MOHS SURGERY  12/10/2015   removed from hand  . TUBAL LIGATION       Home Medications    Prior to Admission medications   Medication Sig Start Date End Date Taking? Authorizing Provider  atorvastatin (LIPITOR) 40 MG tablet Take 40 mg by mouth daily. Dr. Michaelle Birks    [provider]  buPROPion (WELLBUTRIN XL) 150 MG 24 hr tablet Take 3 tablets (450 mg total) by mouth daily. 12/28/15   Glean Hess, MD  HYDROcodone-acetaminophen (NORCO/VICODIN) 5-325 MG tablet Take 1 tablet by mouth 4 (four) times daily as needed. 07/21/16   Sherlene Shams, MD  OMEGA-3 FATTY ACIDS PO Take 1 capsule by mouth daily.    [provider]  predniSONE (DELTASONE) 50 MG tablet Take 1 tablet (50 mg  total) by mouth daily. 07/21/16   Sherlene Shams, MD  triamcinolone cream (KENALOG) 0.1 % Apply 1 application topically 2 (two) times daily. 04/14/16   Glean Hess, MD  valACYclovir (VALTREX) 1000 MG tablet Take 1 tablet (1,000 mg total) by mouth 3 (three) times daily. 07/21/16 07/28/16  Sherlene Shams, MD    Family History Family History  Problem Relation Age of Onset  . Heart failure Mother   . Colon cancer Maternal Grandmother   . Breast cancer Neg Hx     Social History Social History  Substance Use Topics  . Smoking status: Former Research scientist (life sciences)  . Smokeless tobacco: Never Used  . Alcohol use 1.2 oz/week    2 Standard drinks or equivalent per week     Allergies   Pregabalin   Review of Systems Review of Systems  All other systems reviewed and are negative.    Physical Exam Triage Vital Signs ED Triage Vitals  Enc Vitals Group     BP 07/21/16 0934 113/71     Pulse Rate 07/21/16 0934 88     Resp 07/21/16 0934 16     Temp 07/21/16 0934 98 F (36.7 C)     Temp Source 07/21/16 0934 Oral     SpO2 07/21/16 0934 100 %     Weight  07/21/16 0932 213 lb (96.6 kg)     Height 07/21/16 0932 6' (1.829 m)     Pain Score 07/21/16 0933 4     Pain Loc --    Updated Vital Signs BP 113/71 (BP Location: Right Arm)   Pulse 88   Temp 98 F (36.7 C) (Oral)   Resp 16   Ht 6' (1.829 m)   Wt 213 lb (96.6 kg)   SpO2 100%   BMI 28.89 kg/m   Physical Exam  Constitutional: She is oriented to person, place, and time. No distress.  HENT:  Head: Atraumatic.  Eyes:  Conjugate gaze observed, no eye redness/discharge  Neck: Neck supple.  Cardiovascular: Normal rate and regular rhythm.     Pulmonary/Chest: No respiratory distress. She has no wheezes. She has no rales.  Lungs clear, symmetric breath sounds Right lower chest wall, anterior and posterior, with some erythematous patches, the patches are studded with tiny vesicles. One tiny crust on one patch.  Abdominal: She exhibits no  distension.  Musculoskeletal: Normal range of motion.  Neurological: She is alert and oriented to person, place, and time.  Skin: Skin is warm and dry.  Nursing note and vitals reviewed.    UC Treatments / Results   Procedures Procedures (including critical care time) None today  Final Clinical Impressions(s) / UC Diagnoses   Final diagnoses:  Herpes zoster without complication   Anticipate gradual improvement in rash and pain over the next 2-3 weeks.  Rash should be crusting and drying over the next several days.  Once rash is crusted you are not considered contagious.  Consult with grandbabies' pediatrician or daughters' OB about risk of transfering infection; risk is markedly reduced if daughters have had chicken pox.    New Prescriptions New Prescriptions   HYDROCODONE-ACETAMINOPHEN (NORCO/VICODIN) 5-325 MG TABLET    Take 1 tablet by mouth 4 (four) times daily as needed.   PREDNISONE (DELTASONE) 50 MG TABLET    Take 1 tablet (50 mg total) by mouth daily.   VALACYCLOVIR (VALTREX) 1000 MG TABLET    Take 1 tablet (1,000 mg total) by mouth 3 (three) times daily.     Sherlene Shams, MD 07/21/16 [phone]

## 2016-08-09 DIAGNOSIS — R3129 Other microscopic hematuria: Secondary | ICD-10-CM | POA: Insufficient documentation

## 2016-08-16 ENCOUNTER — Ambulatory Visit
Admission: EM | Admit: 2016-08-16 | Discharge: 2016-08-16 | Disposition: A | Discharge status: Home / Self Care | Payer: BLUE CROSS/BLUE SHIELD | Attending: Family Medicine | Admitting: Family Medicine

## 2016-08-16 ENCOUNTER — Ambulatory Visit
Admission: EM | Admit: 2016-08-16 | Discharge: 2016-08-16 | Discharge status: Absent without leave | Payer: BLUE CROSS/BLUE SHIELD

## 2016-08-16 ENCOUNTER — Encounter: Payer: Self-pay | Admitting: Emergency Medicine

## 2016-08-16 DIAGNOSIS — R319 Hematuria, unspecified: Secondary | ICD-10-CM | POA: Diagnosis not present

## 2016-08-16 DIAGNOSIS — R300 Dysuria: Secondary | ICD-10-CM

## 2016-08-16 DIAGNOSIS — N390 Urinary tract infection, site not specified: Secondary | ICD-10-CM | POA: Diagnosis not present

## 2016-08-16 LAB — URINALYSIS, COMPLETE (UACMP) WITH MICROSCOPIC
Bilirubin Urine: NEGATIVE
Glucose, UA: NEGATIVE mg/dL
Ketones, ur: NEGATIVE mg/dL
Nitrite: POSITIVE — AB
PROTEIN: 30 mg/dL — AB
SPECIFIC GRAVITY, URINE: 1.025 (ref 1.005–1.030)
SQUAMOUS EPITHELIAL / LPF: NONE SEEN
pH: 6 (ref 5.0–8.0)

## 2016-08-16 MED ORDER — CEPHALEXIN 500 MG PO CAPS: 500.0000 mg | ORAL_CAPSULE | Freq: Two times a day (BID) | ORAL | 0 refills | Status: AC

## 2016-08-16 NOTE — ED Triage Notes (Signed)
Patient c/o burning when urinating, increase urinary frequency and back pain for the past 2 days.

## 2016-08-16 NOTE — ED Provider Notes (Signed)
[facility] ____________________________________________  Time seen: Approximately 5:46 PM  I have reviewed the triage vital signs and the nursing notes.   HISTORY  Chief Complaint Dysuria and Back Pain  HPI Brandi Nguyen is a 61 y.o. female presents for evaluation of 2 days of urinary frequency, urinary urgency and burning with urination. Patient states she has had UTIs in the past with similar presentation. Denies any recent UTI. Patient states she believes she developed a UTI as she had a cystoscopy approximately one week ago as a process of evaluation for being a potential kidney donor to her nephew. Patient reports that after the cystoscopy the she then had a long drive to [place] and states that she wore a pad and she thinks this caused urethral irritation leading to infection. Denies any other known triggers. Reports continues to eat and drink well. Denies recent antibiotic use. Denies concerns of STDs.  Denies vaginal complaints or vaginal discharge. States noticed some blood in urine today. Denies vaginal bleeding. Denies abdominal pain or atypical back pain. States no fevers. States did take one AZO yesterday which helped some. No azo in today as she did not want to interfere with the urine results. Patient presents otherwise feels well.  Denies chest pain, shortness of breath, abdominal pain,or rash. Denies recent sickness. Denies recent antibiotic use. Reports shingles infection approximately 3 weeks ago, states is fully resolved.  Glean Hess, MD: PCP   Past Medical History:  Diagnosis Date  . Anxiety   . Cancer ([place])    skin ca  . Depression     Patient Active Problem List   Diagnosis Date Noted  . Hx of melanoma excision 04/14/2015  . Dyslipidemia 11/17/2014  . Depression, major, single episode, in partial remission ([place]) 11/17/2014  . Hot flash, menopausal 11/17/2014  . Leg pain 11/17/2014  . Bundle branch block, right 09/05/2013    Past  Surgical History:  Procedure Laterality Date  . COLONOSCOPY  2014  . COLONOSCOPY  02/01/2016   one polyp  . DILATION AND CURETTAGE OF UTERUS  2013   for post-menopausal bleeding  . MELANOMA EXCISION  2015  . MOHS SURGERY  12/10/2015   removed from hand  . TUBAL LIGATION       No current facility-administered medications for this encounter.   Current Outpatient Prescriptions:  .  atorvastatin (LIPITOR) 40 MG tablet, Take 40 mg by mouth daily. Dr. Michaelle Birks, Disp: , Rfl:  .  buPROPion (WELLBUTRIN XL) 150 MG 24 hr tablet, Take 3 tablets (450 mg total) by mouth daily., Disp: 270 tablet, Rfl: 1 .  cephALEXin (KEFLEX) 500 MG capsule, Take 1 capsule (500 mg total) by mouth 2 (two) times daily., Disp: 14 capsule, Rfl: 0 .  OMEGA-3 FATTY ACIDS PO, Take 1 capsule by mouth daily., Disp: , Rfl:  .  triamcinolone cream (KENALOG) 0.1 %, Apply 1 application topically 2 (two) times daily., Disp: 30 g, Rfl: 0  Allergies Prednisone and Pregabalin  Family History  Problem Relation Age of Onset  . Heart failure Mother   . Colon cancer Maternal Grandmother   . Breast cancer Neg Hx     Social History Social History  Substance Use Topics  . Smoking status: Former Research scientist (life sciences)  . Smokeless tobacco: Never Used  . Alcohol use 1.2 oz/week    2 Standard drinks or equivalent per week    Review of Systems Constitutional: No fever/chills Cardiovascular: Denies chest pain. Respiratory: Denies shortness of breath. Gastrointestinal: No abdominal pain.  No nausea, no vomiting.  No diarrhea.  No constipation. Genitourinary: Positive for dysuria. Musculoskeletal: Negative for back pain.   ____________________________________________   PHYSICAL EXAM:  VITAL SIGNS: ED Triage Vitals  Enc Vitals Group     BP 08/16/16 1715 113/72     Pulse Rate 08/16/16 1715 65     Resp 08/16/16 1715 16     Temp 08/16/16 1715 98.6 F (37 C)     Temp Source 08/16/16 1715 Oral     SpO2 08/16/16 1715 99 %     Weight  08/16/16 1713 210 lb (95.3 kg)     Height 08/16/16 1713 6' (1.829 m)     Head Circumference --      Peak Flow --      Pain Score 08/16/16 1713 3     Pain Loc --      Pain Edu? --      Excl. in [place]? --     Constitutional: Alert and oriented. Well appearing and in no acute distress. Cardiovascular: Normal rate, regular rhythm. Grossly normal heart sounds.  Good peripheral circulation. Respiratory: Normal respiratory effort without tachypnea nor retractions. Breath sounds are clear and equal bilaterally. No wheezes, rales, rhonchi. Gastrointestinal: Soft and nontender. No CVA tenderness. Musculoskeletal:   No midline cervical, thoracic or lumbar tenderness to palpation. Neurologic:  Normal speech and language. Speech is normal. No gait instability.  Skin:  Skin is warm, dry. Psychiatric: Mood and affect are normal. Speech and behavior are normal. Patient exhibits appropriate insight and judgment   ___________________________________________   LABS (all labs ordered are listed, but only abnormal results are displayed)  Labs Reviewed  URINALYSIS, COMPLETE (UACMP) WITH MICROSCOPIC - Abnormal; Notable for the following:       Result Value   APPearance HAZY (*)    Hgb urine dipstick MODERATE (*)    Protein, ur 30 (*)    Nitrite POSITIVE (*)    Leukocytes, UA MODERATE (*)    Bacteria, UA MANY (*)    All other components within normal limits  URINE CULTURE     PROCEDURES Procedures    INITIAL IMPRESSION / ASSESSMENT AND PLAN / ED COURSE  Pertinent labs & imaging results that were available during my care of the patient were reviewed by me and considered in my medical decision making (see chart for details).  Well appearing patient. No acute distress. Presents for dysuria for the last 2 days. Urinalysis reviewed. This was in general patient will treat urine infection with Keflex twice a day 7 days. Will culture urine. Patient denies need for Pyridium as that she'll take  over-the-counter Azo as needed. Encourage rest, fluids and supportive care. Encourage follow-up with PCP for urine clearance.Discussed indication, risks and benefits of medications with patient.  Discussed follow up with Primary care physician this week. Discussed follow up and return parameters including no resolution or any worsening concerns. Patient verbalized understanding and agreed to plan.   ____________________________________________   FINAL CLINICAL IMPRESSION(S) / ED DIAGNOSES  Final diagnoses:  Urinary tract infection with hematuria, site unspecified     Discharge Medication List as of 08/16/2016  5:58 PM    START taking these medications   Details  cephALEXin (KEFLEX) 500 MG capsule Take 1 capsule (500 mg total) by mouth 2 (two) times daily., Starting Wed 08/16/2016, Until Wed 08/23/2016, Normal        Note: This dictation was prepared with Dragon dictation along with smaller phrase technology. Any transcriptional errors that result from  this process are unintentional.         Marylene Land, NP 08/16/16 1840

## 2016-08-16 NOTE — Discharge Instructions (Signed)
Take medication as prescribed. Rest. Drink plenty of fluids.  ° °Follow up with your primary care physician this week as needed. Return to Urgent care for new or worsening concerns.  ° °

## 2016-08-19 LAB — URINE CULTURE: Culture: >=100000 — AB

## 2016-09-16 ENCOUNTER — Ambulatory Visit
Admission: EM | Admit: 2016-09-16 | Discharge: 2016-09-16 | Disposition: A | Discharge status: Home / Self Care | Payer: BLUE CROSS/BLUE SHIELD | Attending: Family | Admitting: Family

## 2016-09-16 ENCOUNTER — Encounter: Payer: Self-pay | Admitting: *Deleted

## 2016-09-16 DIAGNOSIS — M5489 Other dorsalgia: Secondary | ICD-10-CM | POA: Diagnosis not present

## 2016-09-16 DIAGNOSIS — N3000 Acute cystitis without hematuria: Secondary | ICD-10-CM

## 2016-09-16 LAB — URINALYSIS, COMPLETE (UACMP) WITH MICROSCOPIC
BILIRUBIN URINE: NEGATIVE
Glucose, UA: NEGATIVE mg/dL
Hgb urine dipstick: NEGATIVE
Ketones, ur: NEGATIVE mg/dL
NITRITE: POSITIVE — AB
PROTEIN: NEGATIVE mg/dL
RBC / HPF: NONE SEEN RBC/hpf (ref 0–5)
Specific Gravity, Urine: 1.02 (ref 1.005–1.030)
pH: 8.5 — ABNORMAL HIGH (ref 5.0–8.0)

## 2016-09-16 MED ORDER — CEPHALEXIN 500 MG PO CAPS: 500.0000 mg | ORAL_CAPSULE | Freq: Two times a day (BID) | ORAL | 0 refills | Status: AC

## 2016-09-16 NOTE — ED Provider Notes (Signed)
CSN: [id number]     Arrival date & time 09/16/16  0801 History   None    Chief Complaint  Patient presents with  . Pelvic Pain   (Consider location/radiation/quality/duration/timing/severity/associated sxs/prior Treatment) CC: urinary pressure started left lower side last night  Endorses urinary frequency, 'heavy sensation' above pubic bone 'is how UTI normally present.'  No severe abdominal pain. No h/o diverticulitis  H/o recurrent utis  Started azo last night and tried azo test with a positive leuks, negative nitrite.  No diarrhea, constipation  No abdominal surgeries  Recently tested to donating kidney including cycscope per patient  Has pcp  Had 'kidney infection' in 20's; no stones.         Seen 07/2016 here for UTIs, treated with keflex with resolve    Past Medical History:  Diagnosis Date  . Anxiety   . Cancer ([place])    skin ca  . Depression    Past Surgical History:  Procedure Laterality Date  . COLONOSCOPY  2014  . COLONOSCOPY  02/01/2016   one polyp  . DILATION AND CURETTAGE OF UTERUS  2013   for post-menopausal bleeding  . MELANOMA EXCISION  2015  . MOHS SURGERY  12/10/2015   removed from hand  . TUBAL LIGATION     Family History  Problem Relation Age of Onset  . Heart failure Mother   . Colon cancer Maternal Grandmother   . Breast cancer Neg Hx    Social History  Substance Use Topics  . Smoking status: Former Research scientist (life sciences)  . Smokeless tobacco: Never Used  . Alcohol use 1.2 oz/week    2 Standard drinks or equivalent per week   OB History    No data available     Review of Systems  Constitutional: Negative for chills and fever.  Respiratory: Negative for cough.   Cardiovascular: Negative for chest pain and palpitations.  Gastrointestinal: Negative for abdominal distention, abdominal pain, blood in stool, constipation, nausea and vomiting.  Genitourinary: Positive for dysuria and frequency. Negative for flank pain, hematuria and vaginal  discharge.  Musculoskeletal: Positive for back pain.    Allergies  Prednisone and Pregabalin  Home Medications   Prior to Admission medications   Medication Sig Start Date End Date Taking? Authorizing Provider  atorvastatin (LIPITOR) 40 MG tablet Take 40 mg by mouth daily. Dr. Michaelle Birks   Yes [provider]  buPROPion (WELLBUTRIN XL) 150 MG 24 hr tablet Take 3 tablets (450 mg total) by mouth daily. 12/28/15  Yes Glean Hess, MD  OMEGA-3 FATTY ACIDS PO Take 1 capsule by mouth daily.   Yes [provider]  cephALEXin (KEFLEX) 500 MG capsule Take 1 capsule (500 mg total) by mouth 2 (two) times daily. 09/16/16 09/23/16  Burnard Hawthorne, FNP  triamcinolone cream (KENALOG) 0.1 % Apply 1 application topically 2 (two) times daily. 04/14/16   Glean Hess, MD   Meds Ordered and Administered this Visit  Medications - No data to display  BP 118/89 (BP Location: Left Arm)   Pulse 95   Temp 98 F (36.7 C) (Oral)   Resp 16   SpO2 100%  No data found.   Physical Exam  Constitutional: She appears well-developed and well-nourished.  Eyes: Conjunctivae are normal.  Cardiovascular: Normal rate, regular rhythm, normal heart sounds and normal pulses.   Pulmonary/Chest: Effort normal and breath sounds normal. She has no wheezes. She has no rhonchi. She has no rales.  Abdominal: Soft. Normal appearance and bowel sounds are  normal. She exhibits no distension, no fluid wave, no ascites and no mass. There is no tenderness. There is no rigidity, no rebound, no guarding and no CVA tenderness.  Neurological: She is alert.  Skin: Skin is warm and dry.  Psychiatric: She has a normal mood and affect. Her speech is normal and behavior is normal. Thought content normal.  Vitals reviewed.   Urgent Care Course     Procedures (including critical care time)  Labs Review Labs Reviewed  URINALYSIS, COMPLETE (UACMP) WITH MICROSCOPIC - Abnormal; Notable for the following:        Result Value   pH 8.5 (*)    Nitrite POSITIVE (*)    Leukocytes, UA TRACE (*)    Squamous Epithelial / LPF 0-5 (*)    Bacteria, UA RARE (*)    All other components within normal limits    Imaging Review No results found.           MDM   1. Acute cystitis without hematuria    Patient well-appearing and afebrile. Urine dipstick is positive for leukocytes, nitrites. Negative for RBCs. Will treat empirically with Keflex as patient responded well with antibiotic last time and based on last urine culture, sensitive.  No severe flank pain, gross hematuria to raise suspicion for renal stone. Reassured by benign abdominal exam. Education provided and encouraged her to follow-up with PCP if urinary tract infections become more frequent. Return precautions given.    Burnard Hawthorne, [place] 09/16/16 [phone]

## 2016-09-16 NOTE — ED Triage Notes (Signed)
Patient started having symptoms of lower abdominal pain back pain and left flank pain suddenly last PM. Patient has an extensive history of UTI.

## 2016-09-16 NOTE — Discharge Instructions (Signed)
Plenty of fluids Start antibiotic as prescribed. Probiotics as discussed As also discussed, please see vigilant for any worsening symptoms including flank pain, blood seen in urine.  We'll call you with culture results

## 2016-10-28 ENCOUNTER — Other Ambulatory Visit: Payer: Self-pay | Admitting: Internal Medicine

## 2016-10-28 DIAGNOSIS — F324 Major depressive disorder, single episode, in partial remission: Secondary | ICD-10-CM

## 2016-10-30 ENCOUNTER — Telehealth: Payer: Self-pay | Admitting: Internal Medicine

## 2016-10-30 NOTE — Telephone Encounter (Signed)
This medication is prescribed by her cardiologist - Dr. Baron Hamper.

## 2016-10-30 NOTE — Telephone Encounter (Signed)
Please call pt and let her know this medication is not prescribed by Dr B. Its prescribed by her Cardiologist. Dr Army Melia does not fill meds that are not prescribed by her. Thank you.

## 2016-10-30 NOTE — Telephone Encounter (Signed)
Pt requesting med refill

## 2016-10-30 NOTE — Telephone Encounter (Signed)
Called patient to schedule annual she stated she will call back with a date.

## 2016-10-30 NOTE — Telephone Encounter (Signed)
Patient is requesting atorvastatin (LIPITOR) 40 MG tablet -please advise

## 2017-01-15 DIAGNOSIS — F900 Attention-deficit hyperactivity disorder, predominantly inattentive type: Secondary | ICD-10-CM | POA: Insufficient documentation

## 2017-03-05 ENCOUNTER — Other Ambulatory Visit: Payer: Self-pay

## 2017-03-05 ENCOUNTER — Telehealth: Payer: Self-pay

## 2017-03-05 DIAGNOSIS — F324 Major depressive disorder, single episode, in partial remission: Secondary | ICD-10-CM

## 2017-03-05 MED ORDER — BUPROPION HCL ER (XL) 150 MG PO TB24: ORAL_TABLET | ORAL | 0 refills | Status: DC

## 2017-03-05 NOTE — Telephone Encounter (Signed)
Received refill request for patient medication for Buproprion. Sent in 90 days with NO refills.. Patient needs to schedule a follow up with Dr Army Melia in the next 90 days to continue to get refills. She was supposed to schedule annual exam for next month but never called back to do so. Please contact patient to schedule appt.  Thanks.

## 2017-04-15 ENCOUNTER — Encounter: Payer: Self-pay | Admitting: Internal Medicine

## 2017-04-15 ENCOUNTER — Other Ambulatory Visit: Payer: Self-pay | Admitting: Internal Medicine

## 2017-04-15 DIAGNOSIS — R3129 Other microscopic hematuria: Secondary | ICD-10-CM

## 2017-04-16 ENCOUNTER — Other Ambulatory Visit: Payer: Self-pay

## 2017-04-16 ENCOUNTER — Ambulatory Visit
Admission: EM | Admit: 2017-04-16 | Discharge: 2017-04-16 | Disposition: A | Discharge status: Home / Self Care | Payer: Managed Care, Other (non HMO) | Attending: Family Medicine | Admitting: Family Medicine

## 2017-04-16 ENCOUNTER — Encounter: Payer: BLUE CROSS/BLUE SHIELD | Admitting: Internal Medicine

## 2017-04-16 DIAGNOSIS — R002 Palpitations: Secondary | ICD-10-CM

## 2017-04-16 DIAGNOSIS — Z87891 Personal history of nicotine dependence: Secondary | ICD-10-CM | POA: Diagnosis not present

## 2017-04-16 DIAGNOSIS — Z79899 Other long term (current) drug therapy: Secondary | ICD-10-CM | POA: Insufficient documentation

## 2017-04-16 DIAGNOSIS — Z888 Allergy status to other drugs, medicaments and biological substances status: Secondary | ICD-10-CM | POA: Insufficient documentation

## 2017-04-16 DIAGNOSIS — R42 Dizziness and giddiness: Secondary | ICD-10-CM

## 2017-04-16 LAB — GLUCOSE, CAPILLARY: Glucose-Capillary: 99 mg/dL (ref 65–99)

## 2017-04-16 NOTE — ED Triage Notes (Signed)
Patient complains of "heart racing" that started while at [facility] around 1 hour ago. Patient states that she felt like she was going to pass out. Patient states that's he was scheduled for CPE with Dr. Army Melia this morning so had been fasting. She did take her morning medication this morning (Adderall and B12 dissolving). Patient denies chest pain but does report that she "feels weird".

## 2017-04-16 NOTE — Discharge Instructions (Signed)
-  Follow with Dr. Kalman Shan -Again combination of your Adderall, B12 in the fasting likely resulted in your symptoms this morning. -Recommend for future lab work, request either appointment or to be able to come in earlier to get to your blood work drawn earlier so as to hopefully avoid further complications with her other medications. -Could also attempt to eat a meal or a snack close to the time that your fasting is to begin.

## 2017-04-16 NOTE — ED Provider Notes (Signed)
[facility]    CSN: [id number] Arrival date & time: 04/16/17  1035     History   Chief Complaint Chief Complaint  Patient presents with  . Palpitations    HPI Brandi Nguyen is a 62 y.o. female.   Patient is a 62 year old female who presents with complaint of racing heart and dizziness.  Patient states she had a appointment for her annual physical this morning with Dr. Army Melia upstairs.  Patient reports she has been fasting for lab work and is not eating this morning.  Patient states she did take her Adderall as well as her B12 vitamin this morning.  Patient states she was walking around [place] and noticed racing heart and got dizzy.  She states she felt like she was going to pass out and had to go down on one knee for a bit.  Patient she was able to finish at [facility] and went to another store but cannot complete her purchase and had to go outside and somewhat curb until the dizziness passed.  Patient states she did eat a banana after she left [employer] which she believes has helped some.  Patient states her last intake was some  Last night about 9 PM and that her last real meal was about 3 PM.  She states she ate a late lunch of steak and baked potato due to flying in from out of town earlier in the day.  Patient reports similar palpitation symptoms in 2017 for which she was seen by Dr. Michaelle Birks at [facility].  She did a 30-day monitor with no concerning arrhythmias noted.  Patient states she feels better now.  She denies any dizziness.  She states her symptoms did start about 9 and until about 1030 when she got here.  Patient also denies any nausea or shortness of breath with this morning's episodes.  Patient states she has been going through evaluations about her being a donor for her nephew.  She states they have noticed that she has had some protein in her urine and that she was going to try to get that worked out with Dr. Merrilee Jansky limits for further testing.      Past  Medical History:  Diagnosis Date  . Anxiety   . Cancer ([place])    skin ca  . Depression     Patient Active Problem List   Diagnosis Date Noted  . 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 Microscopic hematuria 08/09/2016  . Impingement syndrome of left shoulder 05/16/2016  . Hx of melanoma excision 04/14/2015  . Dyslipidemia 11/17/2014  . Depression, major, single episode, in partial remission ([place]) 11/17/2014  . Hot flash, menopausal 11/17/2014  . Leg pain 11/17/2014  . Bundle branch block, right 09/05/2013    Past Surgical History:  Procedure Laterality Date  . COLONOSCOPY  2014  . COLONOSCOPY  02/01/2016   one polyp  . DILATION AND CURETTAGE OF UTERUS  2013   for post-menopausal bleeding  . MELANOMA EXCISION  2015  . MOHS SURGERY  12/10/2015   removed from hand  . TUBAL LIGATION      OB History    No data available       Home Medications    Prior to Admission medications   Medication Sig Start Date End Date Taking? Authorizing Provider  ADDERALL XR 25 MG 24 hr capsule Take 1 capsule by mouth daily. 02/14/17  Yes [provider]  Coenzyme Q10 (CO Q 10) 100 MG CAPS Take by mouth.  Yes [provider]  cyanocobalamin 1000 MCG tablet Take 1,000 mcg by mouth daily.   Yes [provider]  naproxen sodium (ALEVE) 220 MG tablet Take by mouth.   Yes [provider]  OMEGA-3 FATTY ACIDS PO Take 1 capsule by mouth daily.   Yes [provider]  TURMERIC CURCUMIN PO Take by mouth.   Yes [provider]  atorvastatin (LIPITOR) 40 MG tablet Take 20 mg by mouth daily. Dr. Michaelle Birks     [provider]  buPROPion (WELLBUTRIN XL) 150 MG 24 hr tablet TAKE 3 TABLETS(450 MG) BY MOUTH DAILY 03/05/17   Glean Hess, MD  triamcinolone cream (KENALOG) 0.1 % Apply 1 application topically 2 (two) times daily. 04/14/16   Glean Hess, MD    Family History Family History  Problem Relation Age of Onset  .  Heart failure Mother   . Colon cancer Maternal Grandmother   . Breast cancer Neg Hx     Social History Social History   Tobacco Use  . Smoking status: Former Research scientist (life sciences)  . Smokeless tobacco: Never Used  Substance Use Topics  . Alcohol use: Yes    Alcohol/week: 1.2 oz    Types: 2 Standard drinks or equivalent per week    Comment: occasionally  . Drug use: No     Allergies   Prednisone and Pregabalin   Review of Systems Review of Systems  As noted above in HPI.  Other system reviewed and found to be negative.   Physical Exam Triage Vital Signs ED Triage Vitals [04/16/17 1046]  Enc Vitals Group     BP 127/80     Pulse Rate (!) 102     Resp 18     Temp 97.7 F (36.5 C)     Temp Source Oral     SpO2 98 %     Weight 199 lb (90.3 kg)     Height 6' (1.829 m)     Head Circumference      Peak Flow      Pain Score 0     Pain Loc      Pain Edu?      Excl. in [place]?    No data found.  Updated Vital Signs BP 127/80 (BP Location: Left Arm)   Pulse (!) 102   Temp 97.7 F (36.5 C) (Oral)   Resp 18   Ht 6' (1.829 m)   Wt 199 lb (90.3 kg)   SpO2 98%   BMI 26.99 kg/m    Physical Exam  Constitutional: She is oriented to person, place, and time. She appears well-developed and well-nourished. No distress.  Eyes: EOM are normal. Pupils are equal, round, and reactive to light.  Neck: Normal range of motion.  Cardiovascular: Normal rate, regular rhythm and normal heart sounds. Exam reveals no friction rub.  No murmur heard. Pulmonary/Chest: Effort normal and breath sounds normal. No stridor. No respiratory distress. She has no wheezes.  Musculoskeletal: Normal range of motion.  Neurological: She is alert and oriented to person, place, and time. No cranial nerve deficit.  Skin: Skin is warm.  Psychiatric: She has a normal mood and affect.     UC Treatments / Results  Labs (all labs ordered are listed, but only abnormal results are displayed) Labs Reviewed  GLUCOSE,  CAPILLARY  CBG MONITORING, ED    EKG -Normal sinus rhythm at rate of 87.  Possible left atrial enlargement and right bundle branch block.  EKG tracing is consistent  with previous exam in June 2017.  Radiology No results found.  Procedures Procedures (including critical care time)  Medications Ordered in UC Medications - No data to display   Initial Impression / Assessment and Plan / UC Course  I have reviewed the triage vital signs and the nursing notes.  Pertinent labs & imaging results that were available during my care of the patient were reviewed by me and considered in my medical decision making (see chart for details).    Patient is fasting this morning for lab work for her physical exam and workup of her proteinuria.  Patient's last full meal was 3:00 yesterday afternoon but did eat some crackers at 9 PM last night.  Patient this morning took her dissolvable B12 vitamin as well as her Adderall.  Believe is conjunction of those 2 medications plus her fasting all contribute to her symptoms this morning.  The increase in energy needs from the Adderall and vitamin B12 and what likely resulted in a low blood sugar from her fasting are all contributors.  The initial vital signs had her heart rate of 102 which came down to 87 by the time the EKG was done.  Final Clinical Impressions(s) / UC Diagnoses   Final diagnoses:  Dizziness  Palpitations    ED Discharge Orders    None     Patient discussed with Dr. Zenda Alpers.  We are in agreement of the above is the patient's Adderall and vitamin B12 along with her fasting and likely resulting hypoglycemia contributed to her symptoms this morning.  We will have her follow with Dr. Reinaldo Meeker.  Her symptoms have resolved at this time.   Controlled Substance Prescriptions [place] Controlled Substance Registry consulted? Not Applicable   Luvenia Redden, PA-C 04/16/17 1118

## 2017-04-16 NOTE — Progress Notes (Deleted)
    Date:  04/16/2017   Name:  LASONJA LAKINS   DOB:  03/17/1955   MRN:  [id number]   Chief Complaint: No chief complaint on file. GILBERT NARAIN is a 62 y.o. female who presents today for her Complete Annual Exam. She feels {DESC; WELL/FAIRLY WELL/POORLY:18703}. She reports exercising ***. She reports she is sleeping {DESC; WELL/FAIRLY WELL/POORLY:18703}.   Hyperlipidemia  This is a chronic problem. Current antihyperlipidemic treatment includes herbal therapy, diet change and statins. The current treatment provides significant improvement of lipids.   Depression/ADHD - seeing psychiatry and now on Bupropion and Adderall and doing well.   Review of Systems  Patient Active Problem List   Diagnosis Date Noted  . 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 Microscopic hematuria 08/09/2016  . Impingement syndrome of left shoulder 05/16/2016  . Hx of melanoma excision 04/14/2015  . Dyslipidemia 11/17/2014  . Depression, major, single episode, in partial remission ([place]) 11/17/2014  . Hot flash, menopausal 11/17/2014  . Leg pain 11/17/2014  . Bundle branch block, right 09/05/2013    Prior to Admission medications   Medication Sig Start Date End Date Taking? Authorizing Provider  ADDERALL XR 25 MG 24 hr capsule Take 1 capsule by mouth daily. 02/14/17   [provider]  atorvastatin (LIPITOR) 40 MG tablet Take 40 mg by mouth daily. Dr. Michaelle Birks    [provider]  buPROPion (WELLBUTRIN XL) 150 MG 24 hr tablet TAKE 3 TABLETS(450 MG) BY MOUTH DAILY 03/05/17   Glean Hess, MD  naproxen sodium (ALEVE) 220 MG tablet Take by mouth.    [provider]  OMEGA-3 FATTY ACIDS PO Take 1 capsule by mouth daily.    [provider]  triamcinolone cream (KENALOG) 0.1 % Apply 1 application topically 2 (two) times daily. 04/14/16   Glean Hess, MD  TURMERIC CURCUMIN PO Take by mouth.    [provider]    Allergies  Allergen Reactions  .  Prednisone Other (See Comments)  . Pregabalin Other (See Comments)    Causes aches and stiffness    Past Surgical History:  Procedure Laterality Date  . COLONOSCOPY  2014  . COLONOSCOPY  02/01/2016   one polyp  . DILATION AND CURETTAGE OF UTERUS  2013   for post-menopausal bleeding  . MELANOMA EXCISION  2015  . MOHS SURGERY  12/10/2015   removed from hand  . TUBAL LIGATION      Social History   Tobacco Use  . Smoking status: Former Research scientist (life sciences)  . Smokeless tobacco: Never Used  Substance Use Topics  . Alcohol use: Yes    Alcohol/week: 1.2 oz    Types: 2 Standard drinks or equivalent per week  . Drug use: No     Medication list has been reviewed and updated.  PHQ 2/9 Scores 04/14/2016 04/14/2015  PHQ - 2 Score 0 0  PHQ- 9 Score 1 -    Physical Exam  There were no vitals taken for this visit.  Assessment and Plan:

## 2017-05-08 ENCOUNTER — Encounter: Payer: Self-pay | Admitting: Internal Medicine

## 2017-05-09 ENCOUNTER — Other Ambulatory Visit: Payer: Self-pay | Admitting: Internal Medicine

## 2017-05-09 NOTE — Telephone Encounter (Signed)
Requesting referral. Please Advise.

## 2017-05-11 ENCOUNTER — Other Ambulatory Visit: Payer: Self-pay | Admitting: Internal Medicine

## 2017-05-11 DIAGNOSIS — R3129 Other microscopic hematuria: Secondary | ICD-10-CM

## 2017-05-11 NOTE — Telephone Encounter (Signed)
Patient would like [facility] for Referral.

## 2017-05-14 ENCOUNTER — Encounter: Payer: BLUE CROSS/BLUE SHIELD | Admitting: Internal Medicine

## 2017-06-25 ENCOUNTER — Other Ambulatory Visit: Payer: Self-pay | Admitting: Internal Medicine

## 2017-06-25 ENCOUNTER — Telehealth: Payer: Self-pay

## 2017-06-25 DIAGNOSIS — F324 Major depressive disorder, single episode, in partial remission: Secondary | ICD-10-CM

## 2017-06-25 MED ORDER — BUPROPION HCL ER (XL) 150 MG PO TB24: ORAL_TABLET | ORAL | 3 refills | Status: DC

## 2017-06-25 NOTE — Telephone Encounter (Signed)
Wants refill of wellbutrin sent to mail order has CPE next month

## 2017-06-29 ENCOUNTER — Other Ambulatory Visit: Payer: Self-pay | Admitting: Internal Medicine

## 2017-06-29 DIAGNOSIS — Z1231 Encounter for screening mammogram for malignant neoplasm of breast: Secondary | ICD-10-CM

## 2017-07-09 ENCOUNTER — Ambulatory Visit: Payer: Managed Care, Other (non HMO)

## 2017-08-24 ENCOUNTER — Encounter: Payer: Managed Care, Other (non HMO) | Admitting: Internal Medicine

## 2017-08-27 ENCOUNTER — Encounter

## 2018-08-08 ENCOUNTER — Ambulatory Visit (INDEPENDENT_AMBULATORY_CARE_PROVIDER_SITE_OTHER)
Admission: RE | Admit: 2018-08-08 | Discharge: 2018-08-08 | Disposition: A | Discharge status: Home / Self Care | Payer: Managed Care, Other (non HMO) | Source: Ambulatory Visit

## 2018-08-08 DIAGNOSIS — R300 Dysuria: Secondary | ICD-10-CM

## 2018-08-08 MED ORDER — CEPHALEXIN 500 MG PO CAPS
500.0000 mg | ORAL_CAPSULE | Freq: Two times a day (BID) | ORAL | 0 refills | Status: AC
Start: 2018-08-08 — End: 2018-08-15

## 2018-08-08 NOTE — ED Provider Notes (Signed)
Virtual Visit via Video Note:  Brandi Nguyen  initiated request for Telemedicine visit with [facility] team. I connected with Brandi Nguyen  on 08/08/2018 at 1:32 PM  for a synchronized telemedicine visit using a video enabled HIPPA compliant telemedicine application. I verified that I am speaking with Brandi Nguyen  using two identifiers. Yomira Flitton C Anival Pasha, PA-C  was physically located in a [facility] Urgent care site and Brandi Nguyen was located at a different location.   The limitations of evaluation and management by telemedicine as well as the availability of in-person appointments were discussed. Patient was informed that she  may incur a bill ( including co-pay) for this virtual visit encounter. Brandi Nguyen  expressed understanding and gave verbal consent to proceed with virtual visit.     History of Present Illness:Brandi Nguyen  is a 63 y.o. female presents with concern for UTI.  Patient states that yesterday she started to develop slight symptoms of UTI, when she woke up today her symptoms were full-blown.  She endorses frequency, dysuria, urgency as well as incomplete voiding.  She is taking Azo over-the-counter to help with discomfort.  She also has a pressure sensation over her bladder.  Denies fever, nausea or vomiting.  Denies back pain.  She states that she has had many UTIs previously and this feels very similar.  Her last UTI was a few years ago, last culture from 2018 showing slight resistance to Macrobid.  Denies vaginal discharge, itching or irritation.  Past Medical History:  Diagnosis Date  . Anxiety   . Cancer ([place])    skin ca  . Depression     Allergies  Allergen Reactions  . Prednisone Other (See Comments)  . Pregabalin Other (See Comments)    Causes aches and stiffness        Observations/Objective: Physical Exam  Constitutional: She is oriented to person, place, and time and well-developed, well-nourished, and in no distress.  No distress.  HENT:  Head: Normocephalic and atraumatic.  Left Ear: External ear normal.  Eyes:  Wearing glasses  Neck: Normal range of motion. Neck supple.  Pulmonary/Chest: Effort normal. No respiratory distress.  Speaking in full sentences  Musculoskeletal:     Comments: Moving all extremities appropriately  Neurological: She is alert and oriented to person, place, and time.  Skin: Skin is warm.    Assessment and Plan: Patient with clinical UTI symptoms, will treat with Keflex 500 mg twice daily x7 days.  Advised to follow-up in person to obtain urine sample if symptoms not resolving.Discussed strict return precautions. Patient verbalized understanding and is agreeable with plan.   Follow Up Instructions:    I discussed the assessment and treatment plan with the patient. The patient was provided an opportunity to ask questions and all were answered. The patient agreed with the plan and demonstrated an understanding of the instructions.   The patient was advised to call back or seek an in-person evaluation if the symptoms worsen or if the condition fails to improve as anticipated.      Elesa Hacker Aurelia Gras, PA-C  08/08/2018 1:32 PM        Janith Lima, PA-C 08/09/18 [phone]

## 2018-08-08 NOTE — Discharge Instructions (Signed)
Begin keflex twice daily for 1 week for UTI Drink plenty of fluids May continue with azo Follow up in person if antibiotic not helping

## 2018-09-02 ENCOUNTER — Telehealth: Payer: Managed Care, Other (non HMO) | Admitting: Physician Assistant

## 2018-09-02 ENCOUNTER — Encounter: Payer: Self-pay | Admitting: Physician Assistant

## 2018-09-02 DIAGNOSIS — N390 Urinary tract infection, site not specified: Secondary | ICD-10-CM

## 2018-09-02 NOTE — Progress Notes (Signed)
Based on what you shared with me, I feel your condition warrants further evaluation and I recommend that you be seen for a face to face office visit.  NOTE: If you entered your credit card information for this eVisit, you will not be charged. You may see a "hold" on your card for the $35 but that hold will drop off and you will not have a charge processed.   Ms. Faw  Having the recurrence of the UTI after recent infection warrants a face to face evaluation to obtain urine culture for appropriate workup and management.   If you are having a true medical emergency please call 911.     For an urgent face to face visit, [place] has five urgent care centers for your convenience:    [email or web address] to reserve your spot online an avoid wait times  [facility] [address] [place], [place] [address] Modified hours of operation: Monday-Friday, 12 PM to 6 PM  Closed Saturday & Sunday  *Across the street from [place] (New Address!) [address], [place], [place] [address] *Just off [address], across the road from [place] hours of operation: Monday-Friday, 12 PM to 6 PM  Closed Saturday & Sunday   The following sites will take your insurance:  . [facility]    [phone]                  Get Driving Directions  [address], [place] [address] . 10 am to 8 pm Monday-Friday . 12 pm to 8 pm Saturday-Sunday   . [facility] at [place]                  Get Driving Directions  [address], [place], [place] [address] . 8 am to 8 pm Monday-Friday . 9 am to 6 pm Saturday . 11 am to 6 pm Sunday   . [facility] at [place]                  Get Driving Directions   [address] Suite [place], [place] [address] . 8 am to 8 pm Monday-Friday . 8 am to 4  pm Saturday-Sunday    . [facility] at [place]                    Get Driving Directions  [phone]  [address], [place], [place] [address]  . Monday-Friday, 12 PM to 6 PM    Your e-visit answers were reviewed by a board certified advanced clinical practitioner to complete your personal care plan.  Thank you for using e-Visits. I spent 5-10 minutes on review and completion of this note- Lacy Duverney [employer]

## 2019-06-02 ENCOUNTER — Ambulatory Visit: Payer: Managed Care, Other (non HMO)

## 2021-08-12 ENCOUNTER — Encounter: Payer: Self-pay | Admitting: Emergency Medicine

## 2021-08-12 ENCOUNTER — Ambulatory Visit
Admission: EM | Admit: 2021-08-12 | Discharge: 2021-08-12 | Disposition: A | Discharge status: Home / Self Care | Payer: 59 | Attending: Emergency Medicine | Admitting: Emergency Medicine

## 2021-08-12 DIAGNOSIS — L089 Local infection of the skin and subcutaneous tissue, unspecified: Secondary | ICD-10-CM | POA: Diagnosis not present

## 2021-08-12 DIAGNOSIS — R21 Rash and other nonspecific skin eruption: Secondary | ICD-10-CM | POA: Diagnosis not present

## 2021-08-12 MED ORDER — MUPIROCIN 2 % EX OINT: 1.0000 | TOPICAL_OINTMENT | Freq: Two times a day (BID) | CUTANEOUS | 0 refills | Status: DC

## 2021-08-12 MED ORDER — CEPHALEXIN 500 MG PO CAPS: 1000.0000 mg | ORAL_CAPSULE | Freq: Two times a day (BID) | ORAL | 0 refills | Status: AC

## 2021-10-27 ENCOUNTER — Encounter: Payer: Self-pay | Admitting: Emergency Medicine

## 2021-10-27 ENCOUNTER — Ambulatory Visit
Admission: EM | Admit: 2021-10-27 | Discharge: 2021-10-27 | Disposition: A | Discharge status: Home / Self Care | Payer: 59

## 2021-10-27 DIAGNOSIS — H8111 Benign paroxysmal vertigo, right ear: Secondary | ICD-10-CM

## 2021-10-27 MED ORDER — MECLIZINE HCL 25 MG PO TABS: 25.0000 mg | ORAL_TABLET | Freq: Three times a day (TID) | ORAL | 0 refills | Status: AC | PRN

## 2021-10-27 NOTE — ED Triage Notes (Signed)
Pt presents with dizziness worse in the morning and feels off balance that started 1 week ago off and on. When her symptoms started she vomited once. She also has a HA that started this morning. Pt has taken 2 At home covid test that were negative.

## 2021-10-27 NOTE — Discharge Instructions (Signed)
-  Your condition is consistent with benign paroxysmal positional vertigo of the right ear.  I have printed information about how to perform the Epley maneuver and more information about vertigo. - Increase your rest and fluid intake.  I have sent meclizine to the pharmacy to help with your symptoms. - If your symptoms are not improving on your own over the next week or if they worsen, please contact [facility] below where a physical therapist may be able to help you. - If you are headache becomes severe or you have vision changes, multiple episodes of vomiting, worsening dizziness, increased fatigue/lethargy or any acute worsening of symptoms, please call 911 or have someone take to ER.  [facility] [address] [place],  [address] Phone: [phone]

## 2021-10-27 NOTE — ED Provider Notes (Signed)
[facility]    CSN: [id number] Arrival date & time: 10/27/21  1254      History   Chief Complaint Chief Complaint  Patient presents with   Dizziness    HPI Brandi Nguyen is a 66 y.o. female presenting for 1 week history of dizziness which she describes as "feeling drunk."  She says that it happens when she is moving her head and sometimes when she is standing up.  She reports that she had 2 episodes of vomiting yesterday while she was driving.  She says she was feeling excessively dizzy while she was at her father's house so she decided to go home and on the way home she had to pull over and vomit.  She says she started to have a mild headache which she says is more like an allover pressure and rates it about 2 out of 10.  Headache started today.  She reports that she has had headaches in the past and this is not the worst headache that she has had.  She has a little photophobia with it and nausea but no vomiting today.  She has not taken any medication for the headache or her dizziness.  She has never had dizziness like this in the past.  She denies any associated vision changes, facial numbness or drooping, speech or balance difficulty, chest pain, shortness of breath, palpitations, abdominal pain, numbness/tingling or weakness of extremities.  Denies any neck pain or stiffness.  Medical history significant for melanoma, hyperlipidemia, ADHD.  She says that she is very healthy and has no history of cardiopulmonary disease or neurological disease.  Denies any history of TIA or stroke.  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      Home Medications    Prior to Admission medications   Medication Sig Start Date End Date Taking? Authorizing Provider  DULoxetine (CYMBALTA) 30 MG capsule Take by mouth. 10/19/21 11/25/21 Yes [provider]  meclizine (ANTIVERT) 25 MG tablet Take 1 tablet (25 mg total) by mouth 3 (three) times daily as needed for dizziness. 10/27/21  Yes Laurene Footman B, PA-C  ADDERALL XR 25 MG 24 hr capsule Take 1 capsule by mouth daily. 02/14/17   [provider]  atorvastatin (LIPITOR) 40 MG tablet Take 20 mg by mouth daily. Dr. Michaelle Birks     [provider]  buPROPion (WELLBUTRIN XL) 150 MG 24 hr tablet TAKE 3 TABLETS(450 MG) BY MOUTH DAILY 06/25/17   Glean Hess, MD  Coenzyme Q10 (CO Q 10) 100 MG CAPS  Take by mouth.    [provider]  cyanocobalamin 1000 MCG tablet Take 1,000 mcg by mouth daily.    [provider]  mupirocin ointment (BACTROBAN) 2 % Apply 1 Application topically 2 (two) times daily. 08/12/21   Melynda Ripple, MD  naproxen sodium (ALEVE) 220 MG tablet Take by mouth.    [provider]  OMEGA-3 FATTY ACIDS PO Take 1 capsule by mouth daily.    [provider]  TURMERIC CURCUMIN PO Take by mouth.    [provider]    Family History Family History  Problem Relation Age of Onset   Heart failure Mother    Colon cancer Maternal Grandmother    Breast cancer Neg Hx     Social History Social History   Tobacco Use   Smoking status: Former    Smokeless tobacco: Never  Scientific laboratory technician Use: Never used  Substance Use Topics   Alcohol use: Yes    Alcohol/week: 2.0 standard drinks of alcohol    Types: 2 Standard drinks or equivalent per week    Comment: occasionally   Drug use: No     Allergies   Prednisone and Pregabalin   Review of Systems Review of Systems  Constitutional:  Negative for chills, diaphoresis, fatigue and fever.  HENT:  Negative for congestion, ear pain, rhinorrhea, sinus pressure, sinus pain and sore throat.   Eyes:  Positive for photophobia. Negative for visual disturbance.  Respiratory:  Negative for cough and shortness of breath.   Cardiovascular:  Negative for chest pain and palpitations.  Gastrointestinal:  Positive for nausea and vomiting. Negative for abdominal pain.  Musculoskeletal:  Negative for arthralgias, gait problem, myalgias and neck pain.  Skin:  Negative for rash.  Neurological:  Positive for dizziness and headaches. Negative for syncope, speech difficulty and weakness.  Hematological:  Negative for adenopathy.  Psychiatric/Behavioral:  Negative for confusion.      Physical Exam Triage Vital Signs ED Triage Vitals  Enc Vitals Group     BP 10/27/21 1339 119/78     Pulse Rate 10/27/21 1339 75     Resp 10/27/21 1339 16     Temp 10/27/21 1339 98.2 F (36.8 C)     Temp Source 10/27/21 1339 Oral     SpO2 10/27/21 1339 95 %     Weight --      Height --      Head Circumference --      Peak Flow --      Pain Score 10/27/21 1337 3     Pain Loc --      Pain Edu? --      Excl. in [place]? --    No data found.  Updated Vital Signs BP 119/78 (BP Location: Left Arm)   Pulse 75   Temp 98.2 F (36.8 C) (Oral)   Resp 16   SpO2 95%      Physical Exam Vitals and nursing note reviewed.  Constitutional:      General: She is not in acute distress.    Appearance: Normal appearance. She is not ill-appearing or toxic-appearing.  HENT:     Head: Normocephalic and atraumatic.      Right Ear: Tympanic membrane, ear canal and external ear normal.     Left Ear: Tympanic membrane, ear canal and external ear normal.     Nose: Nose normal.     Mouth/Throat:     Mouth: Mucous membranes are moist.     Pharynx:  Oropharynx is clear.  Eyes:     General: No scleral icterus.       Right eye: No discharge.        Left eye: No discharge.     Extraocular Movements: Extraocular movements intact.     Conjunctiva/sclera: Conjunctivae normal.     Pupils: Pupils are equal, round, and reactive to light.  Cardiovascular:     Rate and Rhythm: Normal rate and regular rhythm.     Heart sounds: Normal heart sounds.  Pulmonary:     Effort: Pulmonary effort is normal. No respiratory distress.     Breath sounds: Normal breath sounds.  Abdominal:     Palpations: Abdomen is soft.     Tenderness: There is no abdominal tenderness.  Musculoskeletal:     Cervical back: Neck supple.  Skin:    General: Skin is dry.  Neurological:     General: No focal deficit present.     Mental Status: She is alert and oriented to person, place, and time. Mental status is at baseline.     Cranial Nerves: No cranial nerve deficit.     Motor: No weakness.     Coordination: Coordination normal.     Gait: Gait normal.     Comments: 5 out of 5 strength upper and lower extremities.  Able to recreate the dizzy feeling with patient laid on her back and head slightly off exam table as she is rotating to the right side.  No symptoms when turning to the opposite side.  No nystagmus present  Psychiatric:        Mood and Affect: Mood normal.        Behavior: Behavior normal.        Thought Content: Thought content normal.      UC Treatments / Results  Labs (all labs ordered are listed, but only abnormal results are displayed) Labs Reviewed - No data to display  EKG   Radiology No results found.  Procedures Procedures (including critical care time)  Medications Ordered in UC Medications - No data to  display  Initial Impression / Assessment and Plan / UC Course  I have reviewed the triage vital signs and the nursing notes.  Pertinent labs & imaging results that were available during my care of the patient were reviewed by me and considered in my medical decision making (see chart for details).   66 year old female presenting for vertigo/dizziness, headache, nausea/vomiting.  Original onset was 1 week ago.  New onset headache today which is very mild.  Denies this being the worst headache that she has ever had.  Some mild photophobia.  Patient no head injury.  Patient has never had symptoms like this in the past.  Vitals are all normal and stable and patient is overall well-appearing.  Not presently experiencing dizziness.  HEENT exam is normal.  Cranial nerves intact.  PERRLA.  Able to recreate dizziness when patient rotates her head to the right side.  No symptoms when rotating to the opposite side.  Chest is clear to auscultation heart regular rate and rhythm.  Advised patient her symptoms are most consistent with BPPV.  We discussed her trying to perform the Epley maneuver at home.  Advised plenty of rest and fluids.  Sent meclizine to pharmacy.  Thoroughly reviewed ED precautions with patient advised her to go to the ER/call 911 for headache worsens or she has recurrent vomiting, increased dizziness, weakness, numbness/tingling, facial numbness or drooping, neck pain or stiffness, chest pain, palpitations,  shortness of breath.  Otherwise, advised patient to follow-up with her PCP.  I also gave her information for [facility] in case she may need vestibular rehab.  Final Clinical Impressions(s) / UC Diagnoses   Final diagnoses:  Benign paroxysmal positional vertigo of right ear     Discharge Instructions      -Your condition is consistent with benign paroxysmal positional vertigo of the right ear.  I have printed information about how to perform the Epley maneuver and more  information about vertigo. - Increase your rest and fluid intake.  I have sent meclizine to the pharmacy to help with your symptoms. - If your symptoms are not improving on your own over the next week or if they worsen, please contact [facility] below where a physical therapist may be able to help you. - If you are headache becomes severe or you have vision changes, multiple episodes of vomiting, worsening dizziness, increased fatigue/lethargy or any acute worsening of symptoms, please call 911 or have someone take to ER.  [facility] [address] [place], [place] [address] Phone: [phone]     ED Prescriptions     Medication Sig Dispense Auth. Provider   meclizine (ANTIVERT) 25 MG tablet Take 1 tablet (25 mg total) by mouth 3 (three) times daily as needed for dizziness. 30 tablet Gretta Cool      PDMP not reviewed this encounter.   Danton Clap, PA-C 10/27/21 1501

## 2021-11-22 ENCOUNTER — Encounter: Payer: Self-pay | Admitting: Internal Medicine

## 2021-11-30 ENCOUNTER — Ambulatory Visit
Admission: EM | Admit: 2021-11-30 | Discharge: 2021-11-30 | Disposition: A | Discharge status: Home / Self Care | Payer: 59

## 2021-11-30 ENCOUNTER — Encounter: Payer: Self-pay | Admitting: Emergency Medicine

## 2021-11-30 DIAGNOSIS — R0981 Nasal congestion: Secondary | ICD-10-CM | POA: Diagnosis not present

## 2021-11-30 DIAGNOSIS — R519 Headache, unspecified: Secondary | ICD-10-CM

## 2021-11-30 NOTE — ED Triage Notes (Signed)
Pt c/o sinus pressure and headache for several weeks. She has tried OTC sinus medication that has helped a little.

## 2021-11-30 NOTE — ED Provider Notes (Signed)
[facility]    CSN: [id number] Arrival date & time: 11/30/21  1515      History   Chief Complaint Chief Complaint  Patient presents with   Headache   Sinus Pressure     HPI Brandi Nguyen is a 66 y.o. female.   Patient is a 66 year old female who presents with chief complaint of sinus pain, pressure and headache off and on for couple weeks.  Patient states she was seen here recently for vertigo which is mostly gone.  She is careful sitting up in the mornings to make sure everything is good before she tries to get up but overall that is feeling better.  Patient also reports from facial pain, clear drainage both out of her nose and down the back of her throat.  She also reports some ear fullness.  Patient denies any sore throat but does report intermittent cough that is nonproductive.  She is taken over-the-counter sinus medications which seem to help a little bit.    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      Home Medications    Prior to Admission medications   Medication Sig Start Date End Date Taking? Authorizing Provider  ADDERALL XR 25 MG 24 hr  capsule Take 1 capsule by mouth daily. 02/14/17   [provider]  atorvastatin (LIPITOR) 40 MG tablet Take 20 mg by mouth daily. Dr. Michaelle Birks     [provider]  buPROPion (WELLBUTRIN XL) 150 MG 24 hr tablet TAKE 3 TABLETS(450 MG) BY MOUTH DAILY 06/25/17   Glean Hess, MD  Coenzyme Q10 (CO Q 10) 100 MG CAPS Take by mouth.    [provider]  cyanocobalamin 1000 MCG tablet Take 1,000 mcg by mouth daily.    [provider]  DULoxetine (CYMBALTA) 30 MG capsule Take by mouth. 10/19/21 11/25/21  [provider]  meclizine (ANTIVERT) 25 MG tablet Take 1 tablet (25 mg total) by mouth 3 (three) times daily as needed for dizziness. 10/27/21   Danton Clap, PA-C  mupirocin ointment (BACTROBAN) 2 % Apply 1 Application topically 2 (two) times daily. 08/12/21   Melynda Ripple, MD  naproxen sodium (ALEVE) 220 MG tablet Take by mouth.    [provider]  OMEGA-3 FATTY ACIDS PO Take 1 capsule by mouth daily.    [provider]  TURMERIC CURCUMIN PO Take by mouth.    [provider]    Family History Family History  Problem Relation Age of Onset   Heart failure Mother    Colon  cancer Maternal Grandmother    Breast cancer Neg Hx     Social History Social History   Tobacco Use   Smoking status: Former   Smokeless tobacco: Never  Scientific laboratory technician Use: Never used  Substance Use Topics   Alcohol use: Yes    Alcohol/week: 2.0 standard drinks of alcohol    Types: 2 Standard drinks or equivalent per week    Comment: occasionally   Drug use: No     Allergies   Prednisone and Pregabalin   Review of Systems Review of Systems as noted in HPI.  This is reviewed and found to be negative   Physical Exam Triage Vital Signs ED Triage Vitals  Enc Vitals Group     BP 11/30/21 1547 125/80     Pulse Rate 11/30/21 1547 83     Resp 11/30/21 1547 16     Temp 11/30/21 1547 98.3 F (36.8 C)     Temp Source 11/30/21 1547 Oral      SpO2 11/30/21 1547 95 %     Weight --      Height --      Head Circumference --      Peak Flow --      Pain Score 11/30/21 1546 4     Pain Loc --      Pain Edu? --      Excl. in [place]? --    No data found.  Updated Vital Signs BP 125/80 (BP Location: Left Arm)   Pulse 83   Temp 98.3 F (36.8 C) (Oral)   Resp 16   SpO2 95%     Physical Exam Constitutional:      Appearance: She is well-developed.  HENT:     Head: Normocephalic and atraumatic.     Right Ear: No middle ear effusion. Tympanic membrane is not erythematous.     Left Ear:  No middle ear effusion. Tympanic membrane is not erythematous.     Nose:     Right Sinus: Maxillary sinus tenderness present. No frontal sinus tenderness.     Left Sinus: Maxillary sinus tenderness present. No frontal sinus tenderness.     Mouth/Throat:     Mouth: Mucous membranes are moist.     Pharynx: Oropharynx is clear.     Comments: Mild erythema with clear post nasal drainage  Cardiovascular:     Rate and Rhythm: Normal rate.     Pulses: Normal pulses.     Heart sounds: Normal heart sounds.  Pulmonary:     Effort: Pulmonary effort is normal.     Breath sounds: Normal breath sounds. No wheezing or rhonchi.  Lymphadenopathy:     Cervical: No cervical adenopathy.  Skin:    General: Skin is warm and dry.     Capillary Refill: Capillary refill takes less than 2 seconds.  Neurological:     Mental Status: She is alert.      UC Treatments / Results  Labs (all labs ordered are listed, but only abnormal results are displayed) Labs Reviewed - No data to display  EKG   Radiology No results found.  Procedures Procedures (including critical care time)  Medications Ordered in UC Medications - No data to display  Initial Impression / Assessment and Plan / UC Course  I have reviewed the triage vital signs and the nursing notes.  Pertinent labs & imaging results that were available during my care of the patient were  reviewed by me and considered in my  medical decision making (see chart for details).    Patient with sinus headache, pain, pressure on and off for 2 weeks.  Some ear fullness but no middle ear effusion or erythema.  Some sore throat but also with clear postnasal drainage.  Symptoms likely related to seasonal allergies.  Instructions as below. Final Clinical Impressions(s) / UC Diagnoses   Final diagnoses:  Sinus headache  Nasal sinus congestion     Discharge Instructions      -Your nasal sinus congestion likely related to seasonal allergies has no overt signs of bacterial infection. -Recommend Flonase 2 sprays to each nostril in the morning and then an oral allergy medication at night (Allegra/Zyrtec/Claritin) -Can also use a nasal saline rinse or Nettie pot like product to help keep the sinuses flushed out and open.  This should also help with opening up the passageways to allow of the ear pressure to relief. -Ibuprofen and Tylenol as needed for pain and fever -     ED Prescriptions   None    PDMP not reviewed this encounter.   Luvenia Redden, PA-C 11/30/21 1625

## 2021-11-30 NOTE — Discharge Instructions (Addendum)
-  Your nasal sinus congestion likely related to seasonal allergies has no overt signs of bacterial infection. -Recommend Flonase 2 sprays to each nostril in the morning and then an oral allergy medication at night (Allegra/Zyrtec/Claritin) -Can also use a nasal saline rinse or Nettie pot like product to help keep the sinuses flushed out and open.  This should also help with opening up the passageways to allow of the ear pressure to relief. -Ibuprofen and Tylenol as needed for pain and fever -

## 2022-02-19 ENCOUNTER — Encounter: Payer: Self-pay | Admitting: Emergency Medicine

## 2022-02-19 ENCOUNTER — Ambulatory Visit
Admission: EM | Admit: 2022-02-19 | Discharge: 2022-02-19 | Disposition: A | Discharge status: Home / Self Care | Payer: 59 | Attending: Emergency Medicine | Admitting: Emergency Medicine

## 2022-02-19 DIAGNOSIS — R059 Cough, unspecified: Secondary | ICD-10-CM | POA: Diagnosis not present

## 2022-02-19 DIAGNOSIS — Z79899 Other long term (current) drug therapy: Secondary | ICD-10-CM | POA: Diagnosis not present

## 2022-02-19 DIAGNOSIS — J069 Acute upper respiratory infection, unspecified: Secondary | ICD-10-CM | POA: Diagnosis not present

## 2022-02-19 DIAGNOSIS — Z1152 Encounter for screening for COVID-19: Secondary | ICD-10-CM | POA: Insufficient documentation

## 2022-02-19 LAB — SARS CORONAVIRUS 2 BY RT PCR: SARS Coronavirus 2 by RT PCR: NEGATIVE

## 2022-02-19 MED ORDER — IPRATROPIUM BROMIDE 0.06 % NA SOLN: 2.0000 | Freq: Four times a day (QID) | NASAL | 12 refills | Status: DC

## 2022-02-19 MED ORDER — BENZONATATE 100 MG PO CAPS: 200.0000 mg | ORAL_CAPSULE | Freq: Three times a day (TID) | ORAL | 0 refills | Status: DC

## 2022-02-19 MED ORDER — PROMETHAZINE-DM 6.25-15 MG/5ML PO SYRP: 5.0000 mL | ORAL_SOLUTION | Freq: Four times a day (QID) | ORAL | 0 refills | Status: DC | PRN

## 2022-02-19 NOTE — Discharge Instructions (Signed)
Use OTC Tylenol and Ibuprofen as needed for pain and fever.  Use the Atrovent nasal spray, 2 squirts in each nostril every 6 hours, as needed for runny nose and postnasal drip.  Use the Tessalon Perles every 8 hours during the day.  Take them with a small sip of water.  They may give you some numbness to the base of your tongue or a metallic taste in your mouth, this is normal.  Use the Promethazine DM cough syrup at bedtime for cough and congestion.  It will make you drowsy so do not take it during the day.  Return for reevaluation or see your primary care provider for any new or worsening symptoms.

## 2022-02-19 NOTE — ED Provider Notes (Signed)
[facility]    CSN: [id number] Arrival date & time: 02/19/22  1218      History   Chief Complaint Chief Complaint  Patient presents with   Sinus Problem   Cough    HPI Brandi Nguyen is a 66 y.o. female.   HPI  66 female here for evaluation of respiratory complaints.  The patient reports that her symptoms started this morning and they consist of runny nose and nasal congestion with clear nasal discharge, sinus pressure, ear pain, sore throat, and cough that is productive for clear sputum.  She denies any fever, shortness of breath, or wheezing.  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      Home Medications    Prior to Admission medications   Medication Sig Start Date End Date Taking? Authorizing Provider  atorvastatin (LIPITOR) 40 MG tablet Take 20 mg by mouth daily. Dr. Michaelle Birks    Yes [provider]  benzonatate (TESSALON) 100 MG capsule Take 2 capsules (200 mg total) by mouth every 8 (eight) hours. 02/19/22  Yes Margarette Canada, NP  DULoxetine (CYMBALTA) 30 MG capsule Take by mouth. 10/19/21 02/19/22 Yes [provider]  ipratropium  (ATROVENT) 0.06 % nasal spray Place 2 sprays into both nostrils 4 (four) times daily. 02/19/22  Yes Margarette Canada, NP  promethazine-dextromethorphan (PROMETHAZINE-DM) 6.25-15 MG/5ML syrup Take 5 mLs by mouth 4 (four) times daily as needed. 02/19/22  Yes Margarette Canada, NP  Coenzyme Q10 (CO Q 10) 100 MG CAPS Take by mouth.    [provider]  cyanocobalamin 1000 MCG tablet Take 1,000 mcg by mouth daily.    [provider]  meclizine (ANTIVERT) 25 MG tablet Take 1 tablet (25 mg total) by mouth 3 (three) times daily as needed for dizziness. 10/27/21   Laurene Footman B, PA-C  naproxen sodium (ALEVE) 220 MG tablet Take by mouth.    [provider]  OMEGA-3 FATTY ACIDS PO Take 1 capsule by mouth daily.    [provider]  TURMERIC CURCUMIN PO Take by mouth.    [provider]    Family History Family History  Problem Relation Age of Onset   Heart failure Mother    Colon cancer Maternal Grandmother    Breast cancer Neg Hx     Social History Social History   Tobacco Use   Smoking status: Former   Smokeless tobacco: Never  Scientific laboratory technician Use: Never used  Substance Use Topics   Alcohol use: Yes    Alcohol/week: 2.0 standard drinks of  alcohol    Types: 2 Standard drinks or equivalent per week    Comment: occasionally   Drug use: No     Allergies   Prednisone and Pregabalin   Review of Systems Review of Systems  Constitutional:  Negative for fever.  HENT:  Positive for congestion, ear pain, postnasal drip, rhinorrhea, sinus pressure and sore throat.   Respiratory:  Positive for cough. Negative for shortness of breath and wheezing.      Physical Exam Triage Vital Signs ED Triage Vitals  Enc Vitals Group     BP 02/19/22 1326 (!) 142/96     Pulse Rate 02/19/22 1326 71     Resp 02/19/22 1326 14     Temp 02/19/22 1326 98.2 F (36.8 C)     Temp Source 02/19/22 1326 Oral     SpO2 02/19/22 1326 99 %     Weight 02/19/22 1324 220 lb  (99.8 kg)     Height 02/19/22 1324 6' (1.829 m)     Head Circumference --      Peak Flow --      Pain Score 02/19/22 1323 7     Pain Loc --      Pain Edu? --      Excl. in [place]? --    No data found.  Updated Vital Signs BP (!) 142/96 (BP Location: Left Arm)   Pulse 71   Temp 98.2 F (36.8 C) (Oral)   Resp 14   Ht 6' (1.829 m)   Wt 220 lb (99.8 kg)   SpO2 99%   BMI 29.84 kg/m   Visual Acuity Right Eye Distance:   Left Eye Distance:   Bilateral Distance:    Right Eye Near:   Left Eye Near:    Bilateral Near:     Physical Exam Vitals and nursing note reviewed.  Constitutional:      Appearance: Normal appearance. She is not ill-appearing.  HENT:     Head: Normocephalic and atraumatic.     Right Ear: Tympanic membrane, ear canal and external ear normal. There is no impacted cerumen.     Left Ear: Tympanic membrane, ear canal and external ear normal. There is no impacted cerumen.     Nose: Congestion and rhinorrhea present.     Comments: His mucosa is erythematous and edematous with clear discharge in both nares.    Mouth/Throat:     Mouth: Mucous membranes are moist.     Pharynx: Oropharynx is clear. Posterior oropharyngeal erythema present.     Comments: Patient has mild posterior perennial erythema and injection with clear postnasal drip. Cardiovascular:     Rate and Rhythm: Normal rate and regular rhythm.     Pulses: Normal pulses.     Heart sounds: Normal heart sounds. No murmur heard.    No friction rub. No gallop.  Pulmonary:     Effort: Pulmonary effort is normal.     Breath sounds: Normal breath sounds. No wheezing, rhonchi or rales.  Musculoskeletal:     Cervical back: Normal range of motion and neck supple.  Lymphadenopathy:     Cervical: No cervical adenopathy.  Skin:    General: Skin is warm and dry.     Capillary Refill: Capillary refill takes less than 2 seconds.     Findings: No erythema or rash.  Neurological:     General: No focal deficit  present.     Mental Status: She is alert and oriented to person, place, and time.  Psychiatric:  Mood and Affect: Mood normal.        Behavior: Behavior normal.        Thought Content: Thought content normal.        Judgment: Judgment normal.      UC Treatments / Results  Labs (all labs ordered are listed, but only abnormal results are displayed) Labs Reviewed  SARS CORONAVIRUS 2 BY RT PCR    EKG   Radiology No results found.  Procedures Procedures (including critical care time)  Medications Ordered in UC Medications - No data to display  Initial Impression / Assessment and Plan / UC Course  I have reviewed the triage vital signs and the nursing notes.  Pertinent labs & imaging results that were available during my care of the patient were reviewed by me and considered in my medical decision making (see chart for details).   Patient is a nontoxic-appearing 66 year old female here for evaluation of 1 day worth of respiratory symptoms as outlined in HPI above.  The patient is not in any acute distress and she does not menstruate any dyspnea or tachypnea.  No purulent discharge.  When asked about sick contacts she said she may have been around somebody with similar symptoms but she is unsure.  She does have inflammation of her upper respiratory tree as evidenced by inflamed nasal mucosa with clear rhinorrhea and erythema to the posterior pharynx with clear postnasal drip.  Her lungs are clear to auscultation all fields.  I COVID PCR was collected at triage and is negative.  I will discharge her home with a diagnosis of viral URI with a cough and treat her symptoms with over-the-counter Tylenol and NSAIDs, and treat her nasal congestion with Atrovent nasal spray.  I will also prescribe Tessalon Perles and Promethazine DM cough syrup With cough and congestion.  Return precautions reviewed.   Final Clinical Impressions(s) / UC Diagnoses   Final diagnoses:  Viral URI with  cough     Discharge Instructions      Use OTC Tylenol and Ibuprofen as needed for pain and fever.  Use the Atrovent nasal spray, 2 squirts in each nostril every 6 hours, as needed for runny nose and postnasal drip.  Use the Tessalon Perles every 8 hours during the day.  Take them with a small sip of water.  They may give you some numbness to the base of your tongue or a metallic taste in your mouth, this is normal.  Use the Promethazine DM cough syrup at bedtime for cough and congestion.  It will make you drowsy so do not take it during the day.  Return for reevaluation or see your primary care provider for any new or worsening symptoms.      ED Prescriptions     Medication Sig Dispense Auth. Provider   benzonatate (TESSALON) 100 MG capsule Take 2 capsules (200 mg total) by mouth every 8 (eight) hours. 21 capsule Margarette Canada, NP   ipratropium (ATROVENT) 0.06 % nasal spray Place 2 sprays into both nostrils 4 (four) times daily. 15 mL Margarette Canada, NP   promethazine-dextromethorphan (PROMETHAZINE-DM) 6.25-15 MG/5ML syrup Take 5 mLs by mouth 4 (four) times daily as needed. 118 mL Margarette Canada, NP      PDMP not reviewed this encounter.   Margarette Canada, NP 02/19/22 1419

## 2022-02-19 NOTE — ED Triage Notes (Signed)
Patient c/o sinus congestion and pressure, runny nose, and cough that started this morning.  Patient unsure of fevers.

## 2022-07-11 ENCOUNTER — Ambulatory Visit
Admission: EM | Admit: 2022-07-11 | Discharge: 2022-07-11 | Disposition: A | Discharge status: Home / Self Care | Payer: Medicare HMO | Attending: Emergency Medicine | Admitting: Emergency Medicine

## 2022-07-11 DIAGNOSIS — R300 Dysuria: Secondary | ICD-10-CM | POA: Insufficient documentation

## 2022-07-11 DIAGNOSIS — N390 Urinary tract infection, site not specified: Secondary | ICD-10-CM | POA: Insufficient documentation

## 2022-07-11 LAB — URINALYSIS, W/ REFLEX TO CULTURE (INFECTION SUSPECTED)
Bilirubin Urine: NEGATIVE
Glucose, UA: NEGATIVE mg/dL
Ketones, ur: NEGATIVE mg/dL
Nitrite: NEGATIVE
Protein, ur: >300 mg/dL — AB
RBC / HPF: >50 RBC/hpf (ref 0–5)
Specific Gravity, Urine: 1.025 (ref 1.005–1.030)
WBC, UA: >50 WBC/hpf (ref 0–5)
pH: 7 (ref 5.0–8.0)

## 2022-07-11 MED ORDER — CEPHALEXIN 500 MG PO CAPS: 500.0000 mg | ORAL_CAPSULE | Freq: Four times a day (QID) | ORAL | 0 refills | Status: AC

## 2022-07-11 MED ORDER — PHENAZOPYRIDINE HCL 200 MG PO TABS: 200.0000 mg | ORAL_TABLET | Freq: Three times a day (TID) | ORAL | 0 refills | Status: DC

## 2022-07-11 NOTE — ED Triage Notes (Signed)
Pt c/o Possible UTI x3days  Pt is having urinary frequency, pressure, and burning.  Pt has not taken any AZO.

## 2022-07-11 NOTE — Discharge Instructions (Signed)
Drink plenty of fluids, avoid caffeine.  Take meds as directed.  Follow-up with PCP regarding refill request, may want to contact your pharmacy and see if they can refill meds before you go on vacation.  Return as needed.

## 2022-07-11 NOTE — ED Provider Notes (Addendum)
[facility]    CSN: [id number] Arrival date & time: 07/11/22  0915      History   Chief Complaint Chief Complaint  Patient presents with   Urinary Tract Infection    HPI Brandi Nguyen is a 67 y.o. female.   67 year old female, Brandi Nguyen, presents to urgent care with chief complaint of urinary tract symptoms 3 days: Dysuria, frequency  The history is provided by the patient. No language interpreter was used.    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      Home Medications    Prior to Admission medications   Medication Sig Start Date End Date Taking? Authorizing Provider  amphetamine-dextroamphetamine (ADDERALL XR) 25 MG 24 hr capsule Take by mouth. 06/27/22 09/25/22 Yes [provider]  atorvastatin (LIPITOR) 40 MG tablet Take 20 mg by mouth daily. Dr. Earma Reading    Yes [provider]  cephALEXin (KEFLEX) 500 MG capsule Take 1 capsule (500 mg total) by mouth 4 (four) times daily for 7 days. 07/11/22 07/18/22 Yes Givanni Staron, Para March, NP  DULoxetine (CYMBALTA) 30 MG capsule Take by mouth. 10/19/21  07/11/22 Yes [provider]  ipratropium (ATROVENT) 0.06 % nasal spray Place 2 sprays into both nostrils 4 (four) times daily. 02/19/22  Yes Becky Augusta, NP  meclizine (ANTIVERT) 25 MG tablet Take 1 tablet (25 mg total) by mouth 3 (three) times daily as needed for dizziness. 10/27/21  Yes Shirlee Latch, PA-C  phenazopyridine (PYRIDIUM) 200 MG tablet Take 1 tablet (200 mg total) by mouth 3 (three) times daily. 07/11/22  Yes Dilpreet Faires, Para March, NP  benzonatate (TESSALON) 100 MG capsule Take 2 capsules (200 mg total) by mouth every 8 (eight) hours. 02/19/22   Becky Augusta, NP  Coenzyme Q10 (CO Q 10) 100 MG CAPS Take by mouth.    [provider]  cyanocobalamin 1000 MCG tablet Take 1,000 mcg by mouth daily.    [provider]  naproxen sodium (ALEVE) 220 MG tablet Take by mouth.    [provider]  OMEGA-3 FATTY ACIDS PO Take 1 capsule by mouth daily.    [provider]  promethazine-dextromethorphan (PROMETHAZINE-DM) 6.25-15 MG/5ML syrup Take 5 mLs by mouth 4 (four) times daily as needed. 02/19/22   Becky Augusta, NP  TURMERIC CURCUMIN PO Take by mouth.    [provider]    Family History Family History  Problem Relation Age of Onset   Heart failure Mother    Colon cancer Maternal Grandmother  Breast cancer Neg Hx     Social History Social History   Tobacco Use   Smoking status: Former   Smokeless tobacco: Never  Building services engineer Use: Never used  Substance Use Topics   Alcohol use: Yes    Alcohol/week: 2.0 standard drinks of alcohol    Types: 2 Standard drinks or equivalent per week    Comment: occasionally   Drug use: No     Allergies   Prednisone and Pregabalin   Review of Systems Review of Systems  Constitutional:  Negative for fever.  Genitourinary:  Negative for dysuria and frequency.  All other systems reviewed and are negative.    Physical Exam Triage Vital Signs ED Triage Vitals  Enc Vitals Group      BP 07/11/22 0935 (!) 142/85     Pulse Rate 07/11/22 0935 76     Resp --      Temp 07/11/22 0935 98.8 F (37.1 C)     Temp Source 07/11/22 0935 Oral     SpO2 07/11/22 0935 95 %     Weight 07/11/22 0933 215 lb (97.5 kg)     Height 07/11/22 0933 6' (1.829 m)     Head Circumference --      Peak Flow --      Pain Score 07/11/22 0933 2     Pain Loc --      Pain Edu? --      Excl. in [place]? --    No data found.  Updated Vital Signs BP (!) 142/85 (BP Location: Left Arm)   Pulse 76   Temp 98.8 F (37.1 C) (Oral)   Ht 6' (1.829 m)   Wt 215 lb (97.5 kg)   SpO2 95%   BMI 29.16 kg/m   Visual Acuity Right Eye Distance:   Left Eye Distance:   Bilateral Distance:    Right Eye Near:   Left Eye Near:    Bilateral Near:     Physical Exam Vitals and nursing note reviewed.  Constitutional:      General: She is not in acute distress.    Appearance: She is well-developed.  HENT:     Head: Normocephalic and atraumatic.  Eyes:     Conjunctiva/sclera: Conjunctivae normal.  Cardiovascular:     Rate and Rhythm: Normal rate and regular rhythm.     Heart sounds: No murmur heard. Pulmonary:     Effort: Pulmonary effort is normal. No respiratory distress.     Breath sounds: Normal breath sounds.  Abdominal:     Palpations: Abdomen is soft.     Tenderness: There is abdominal tenderness in the suprapubic area.  Musculoskeletal:        General: No swelling.     Cervical back: Neck supple.     Comments: Negative CVAT tenderness bilaterally  Skin:    General: Skin is warm and dry.     Capillary Refill: Capillary refill takes less than 2 seconds.  Neurological:     General: No focal deficit present.     Mental Status: She is alert and oriented to person, place, and time.     GCS: GCS eye subscore is 4. GCS verbal subscore is 5. GCS motor subscore is 6.  Psychiatric:        Attention and Perception: Attention normal.        Mood and Affect: Mood normal.        Speech: Speech normal.  Behavior: Behavior normal.      UC Treatments / Results  Labs (all labs ordered are listed, but only abnormal results are displayed) Labs Reviewed  URINALYSIS, W/ REFLEX TO CULTURE (INFECTION SUSPECTED) - Abnormal; Notable for the following components:      Result Value   APPearance CLOUDY (*)    Hgb urine dipstick LARGE (*)    Protein, ur >300 (*)    Leukocytes,Ua LARGE (*)    Bacteria, UA MANY (*)    All other components within normal limits  URINE CULTURE    EKG   Radiology No results found.  Procedures Procedures (including critical care time)  Medications Ordered in UC Medications - No data to display  Initial Impression / Assessment and Plan / UC Course  I have reviewed the triage vital signs and the nursing notes.  Pertinent labs & imaging results that were available during my care of the patient were reviewed by me and considered in my medical decision making (see chart for details).  Clinical Course as of 07/11/22 1121  Tue Jul 11, 2022  1000 Patient requesting refills for long-term meds as she is going on a vacation soon.  Discussed with patient she could contact her primary provider for refill request or the pharmacy that she has medications filled with for refill request.  Patient verbalized understanding to this provider. [JD]    Clinical Course User Index [JD] Deztinee Lohmeyer, Para March, NP    Ddx: UTI Final Clinical Impressions(s) / UC Diagnoses   Final diagnoses:  Dysuria  Acute UTI     Discharge Instructions      Drink plenty of fluids, avoid caffeine.  Take meds as directed.  Follow-up with PCP regarding refill request, may want to contact your pharmacy and see if they can refill meds before you go on vacation.  Return as needed.     ED Prescriptions     Medication Sig Dispense Auth. Provider   cephALEXin (KEFLEX) 500 MG capsule Take 1 capsule (500 mg total) by mouth 4 (four) times daily for 7 days. 28 capsule Chriss Redel, NP    phenazopyridine (PYRIDIUM) 200 MG tablet Take 1 tablet (200 mg total) by mouth 3 (three) times daily. 6 tablet Caytlin Better, Para March, NP      PDMP not reviewed this encounter.   Clancy Gourd, NP 07/11/22 1116    Clancy Gourd, NP 07/11/22 1121

## 2022-07-13 LAB — URINE CULTURE: Culture: >=100000 — AB

## 2022-08-01 ENCOUNTER — Ambulatory Visit: Payer: Medicare HMO

## 2022-10-02 ENCOUNTER — Telehealth: Payer: Self-pay | Admitting: *Deleted

## 2022-10-02 NOTE — Telephone Encounter (Signed)
I connected with Brandi Nguyen on 8/12 at 1058 by telephone and verified that I am speaking with the correct person using two identifiers. According to the patient's chart they are due for follow up with  [facility]. Pt scheduled. There are no transportation issues at this time. Nothing further was needed at the end of our conversation.

## 2022-11-09 ENCOUNTER — Ambulatory Visit: Payer: Managed Care, Other (non HMO) | Admitting: Internal Medicine

## 2023-03-08 ENCOUNTER — Other Ambulatory Visit: Payer: Self-pay | Admitting: Family Medicine

## 2023-03-08 DIAGNOSIS — E041 Nontoxic single thyroid nodule: Secondary | ICD-10-CM

## 2023-03-13 ENCOUNTER — Ambulatory Visit
Admission: RE | Admit: 2023-03-13 | Discharge: 2023-03-13 | Disposition: A | Discharge status: Home / Self Care | Payer: Medicare HMO | Source: Ambulatory Visit | Attending: Family Medicine | Admitting: Family Medicine

## 2023-03-13 DIAGNOSIS — E041 Nontoxic single thyroid nodule: Secondary | ICD-10-CM | POA: Diagnosis present

## 2023-03-28 ENCOUNTER — Other Ambulatory Visit: Payer: Self-pay | Admitting: Pediatrics

## 2023-03-28 DIAGNOSIS — E041 Nontoxic single thyroid nodule: Secondary | ICD-10-CM

## 2023-04-03 NOTE — Progress Notes (Signed)
Patient for Brandi Nguyen guided RT Mid Thyroid Biopsy on Wed 04/04/23, I called and spoke with the patient on the phone and gave pre-procedure instructions. Pt was made aware to be here at 12:30p and check in at the registration desk. Pt stated understanding.  Called 04/03/2023

## 2023-04-04 ENCOUNTER — Ambulatory Visit
Admission: RE | Admit: 2023-04-04 | Discharge: 2023-04-04 | Disposition: A | Discharge status: Home / Self Care | Payer: Medicare HMO | Source: Ambulatory Visit | Attending: Pediatrics | Admitting: Pediatrics

## 2023-04-04 ENCOUNTER — Other Ambulatory Visit (HOSPITAL_COMMUNITY): Payer: Self-pay | Admitting: Interventional Radiology

## 2023-04-04 DIAGNOSIS — E041 Nontoxic single thyroid nodule: Secondary | ICD-10-CM | POA: Insufficient documentation

## 2023-04-04 MED ORDER — LIDOCAINE HCL (PF) 1 % IJ SOLN
6.0000 mL | Freq: Once | INTRAMUSCULAR | Status: AC
  Administered 2023-04-04: 6 mL via INTRADERMAL
  Filled 2023-04-04: qty 6

## 2023-04-10 LAB — CYTOLOGY - NON PAP

## 2023-06-07 ENCOUNTER — Other Ambulatory Visit: Payer: Self-pay | Admitting: Pediatrics

## 2023-06-07 DIAGNOSIS — Z1231 Encounter for screening mammogram for malignant neoplasm of breast: Secondary | ICD-10-CM

## 2023-10-22 ENCOUNTER — Ambulatory Visit
Admission: EM | Admit: 2023-10-22 | Discharge: 2023-10-22 | Disposition: A | Discharge status: Home / Self Care | Attending: Family Medicine | Admitting: Family Medicine

## 2023-10-22 ENCOUNTER — Other Ambulatory Visit: Payer: Self-pay

## 2023-10-22 DIAGNOSIS — N3091 Cystitis, unspecified with hematuria: Secondary | ICD-10-CM | POA: Diagnosis present

## 2023-10-22 LAB — URINALYSIS, W/ REFLEX TO CULTURE (INFECTION SUSPECTED): WBC, UA: >50 WBC/hpf (ref 0–5)

## 2023-10-22 MED ORDER — NITROFURANTOIN MONOHYD MACRO 100 MG PO CAPS: 100.0000 mg | ORAL_CAPSULE | Freq: Two times a day (BID) | ORAL | 0 refills | Status: DC

## 2023-10-22 NOTE — ED Provider Notes (Signed)
 [facility]    CSN: [id number] Arrival date & time: 10/22/23  1251      History   Chief Complaint Chief Complaint  Patient presents with   UTI     HPI HPI ALYCIA COOPERWOOD is a 68 y.o. female.    Reena LITTIE Ng presents for urinary frequency, dysuria and urinary odor that started yesterday.  Has some hematuria, cloudy urine and abdominal cramping.  Tried AZO prior to arrival. Denies fever, vomiting, nausea or back pain.  No LMP recorded. Patient is postmenopausal.        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      Home Medications    Prior to Admission medications   Medication Sig Start Date End Date Taking? Authorizing Provider  nitrofurantoin , macrocrystal-monohydrate, (MACROBID ) 100 MG capsule Take 1 capsule (100 mg total) by mouth 2 (two) times daily. 10/22/23  Yes Leola Fiore, DO  atorvastatin (LIPITOR) 40 MG tablet Take 20 mg by mouth daily. Dr. Brenna     [provider]  Coenzyme Q10 (CO Q 10) 100 MG CAPS Take by mouth.    [provider]  cyanocobalamin 1000 MCG tablet Take  1,000 mcg by mouth daily.    [provider]  DULoxetine (CYMBALTA) 30 MG capsule Take by mouth. 10/19/21 07/11/22  [provider]  ipratropium (ATROVENT ) 0.06 % nasal spray Place 2 sprays into both nostrils 4 (four) times daily. 02/19/22   Bernardino Ditch, NP  meclizine  (ANTIVERT ) 25 MG tablet Take 1 tablet (25 mg total) by mouth 3 (three) times daily as needed for dizziness. 10/27/21   Arvis Huxley B, PA-C  naproxen sodium (ALEVE) 220 MG tablet Take by mouth.    [provider]  OMEGA-3 FATTY ACIDS PO Take 1 capsule by mouth daily.    [provider]  phenazopyridine  (PYRIDIUM ) 200 MG tablet Take 1 tablet (200 mg total) by mouth 3 (three) times daily. 07/11/22   Defelice, Jeanette, NP  promethazine -dextromethorphan (PROMETHAZINE -DM) 6.25-15 MG/5ML syrup Take 5 mLs by mouth 4 (four) times daily as needed. 02/19/22   Bernardino Ditch, NP  TURMERIC CURCUMIN PO Take by mouth.    [provider]    Family History Family History  Problem Relation Age of Onset   Heart failure Mother    Colon cancer Maternal Grandmother    Breast cancer Neg Hx     Social History Social History   Tobacco Use   Smoking status: Former   Smokeless tobacco: Never  Vaping Use   Vaping status: Never Used  Substance Use Topics   Alcohol use: Yes    Alcohol/week: 2.0 standard drinks of alcohol    Types: 2 Standard drinks or equivalent per week    Comment: occasionally   Drug use: No     Allergies   Prednisone  and Pregabalin   Review of Systems Review of Systems: :negative unless otherwise stated in HPI.      Physical Exam Triage Vital Signs ED Triage Vitals  Encounter Vitals Group     BP 10/22/23 1354 (!) 139/90     Girls Systolic BP Percentile --      Girls Diastolic BP Percentile --      Boys Systolic BP Percentile --      Boys Diastolic BP Percentile --      Pulse Rate 10/22/23 1354 60     Resp 10/22/23 1354 18     Temp 10/22/23 1354 97.8 F (36.6 C)      Temp Source 10/22/23 1354 Oral     SpO2 10/22/23 1354 100 %     Weight --      Height --      Head Circumference --      Peak Flow --      Pain Score 10/22/23 1352 0     Pain Loc --      Pain Education --      Exclude from Growth Chart --    No data found.  Updated Vital Signs BP (!) 139/90 (BP Location: Left Arm) Comment: NOTIFIED PROVIDER  Pulse 60   Temp 97.8 F (36.6 C) (Oral)   Resp 18   SpO2 100%   Visual Acuity Right Eye Distance:   Left Eye Distance:   Bilateral Distance:    Right Eye Near:   Left Eye Near:    Bilateral Near:     Physical Exam GEN: well appearing female in no acute distress  CVS: well perfused  RESP: speaking in full sentences without pause  ABD: soft, non-tender, non-distended, no palpable masses, no CVA tenderness      UC Treatments / Results  Labs (all labs ordered are listed, but only abnormal results are displayed) Labs Reviewed  URINALYSIS, W/ REFLEX TO CULTURE (INFECTION SUSPECTED) - Abnormal; Notable for the following components:      Result Value   Color, Urine ORANGE (*)    APPearance CLOUDY (*)    Glucose, UA   (*)    Value: TEST NOT REPORTED DUE TO COLOR INTERFERENCE OF URINE PIGMENT   Hgb urine dipstick   (*)    Value: TEST NOT REPORTED DUE TO COLOR INTERFERENCE OF URINE PIGMENT   Bilirubin Urine   (*)    Value: TEST NOT REPORTED DUE TO COLOR INTERFERENCE OF URINE PIGMENT   Ketones, ur   (*)    Value: TEST NOT REPORTED DUE TO COLOR INTERFERENCE OF URINE PIGMENT   Protein, ur   (*)    Value: TEST NOT REPORTED DUE TO COLOR INTERFERENCE OF URINE PIGMENT   Nitrite   (*)    Value: TEST NOT REPORTED DUE TO COLOR INTERFERENCE OF URINE PIGMENT   Leukocytes,Ua   (*)    Value: TEST NOT REPORTED DUE TO COLOR INTERFERENCE OF URINE PIGMENT   Bacteria, UA MANY (*)    All other components within normal limits  URINE CULTURE    EKG   Radiology No results found.  Procedures Procedures (including critical care  time)  Medications Ordered  in UC Medications - No data to display  Initial Impression / Assessment and Plan / UC Course  I have reviewed the triage vital signs and the nursing notes.  Pertinent labs & imaging results that were available during my care of the patient were reviewed by me and considered in my medical decision making (see chart for details).       Acute cystitis:  Patient is a 68 y.o. female  who presents for 1 day of dysuria and hematuria.  Overall patient is well-appearing and afebrile.  Vital signs stable.  Urinalysis with microscopy concerning for  acute cystitis though inconclusive due to AZO use.  Hematuria supported on microscopy.  Treat with Macrobid  2 times daily for 5 days.  Urine culture obtained.  Follow-up sensitivities and change antibiotics, if needed. Reviewed previous urine culture from May 2024 which showed E coli that was sensitive to Macrobid .  Return precautions including abdominal pain, fever, chills, nausea, or vomiting given. Follow-up,  if symptoms not improving or getting worse. Discussed MDM, treatment plan and plan for follow-up with patient who agrees with plan.        Final Clinical Impressions(s) / UC Diagnoses   Final diagnoses:  Hemorrhagic cystitis     Discharge Instructions       You have a urinary tract infection. I sent your urine for culture to be sure the antibiotic prescribed will treat your infection. Someone may call you to change antibiotics. Stop by the pharmacy to pick up your prescriptions.  Follow up with your primary care provider or return to the urgent care, if not improving.      ED Prescriptions     Medication Sig Dispense Auth. Provider   nitrofurantoin , macrocrystal-monohydrate, (MACROBID ) 100 MG capsule Take 1 capsule (100 mg total) by mouth 2 (two) times daily. 10 capsule Marylee Belzer, DO      PDMP not reviewed this encounter.   Mingo Siegert, DO 10/22/23 1425

## 2023-10-22 NOTE — ED Triage Notes (Signed)
 Pt is here with frequency, burning, and odor when urinating that started yesterday, pt has taken AZO to relieve discomfort.

## 2023-10-22 NOTE — Discharge Instructions (Signed)
 You have a urinary tract infection. I sent your urine for culture to be sure the antibiotic prescribed will treat your infection. Someone may call you to change antibiotics. Stop by the pharmacy to pick up your prescriptions.  Follow up with your primary care provider or return to the urgent care, if not improving.

## 2023-10-24 LAB — URINE CULTURE

## 2023-10-26 ENCOUNTER — Ambulatory Visit (HOSPITAL_COMMUNITY): Payer: Self-pay

## 2023-12-29 ENCOUNTER — Ambulatory Visit: Admission: RE | Admit: 2023-12-29 | Discharge: 2023-12-29 | Disposition: A | Discharge status: Home / Self Care

## 2023-12-29 VITALS — BP 120/81 | HR 76 | Temp 98.5°F | Resp 15 | Ht 72.0 in | Wt 190.0 lb

## 2023-12-29 DIAGNOSIS — U071 COVID-19: Secondary | ICD-10-CM | POA: Diagnosis not present

## 2023-12-29 DIAGNOSIS — J029 Acute pharyngitis, unspecified: Secondary | ICD-10-CM | POA: Diagnosis not present

## 2023-12-29 DIAGNOSIS — R051 Acute cough: Secondary | ICD-10-CM | POA: Diagnosis not present

## 2023-12-29 LAB — POC SOFIA SARS ANTIGEN FIA: SARS Coronavirus 2 Ag: POSITIVE — AB

## 2023-12-29 MED ORDER — PAXLOVID (300/100) 20 X 150 MG & 10 X 100MG PO TBPK: 3.0000 | ORAL_TABLET | Freq: Two times a day (BID) | ORAL | 0 refills | Status: AC

## 2023-12-29 MED ORDER — BENZONATATE 200 MG PO CAPS: 200.0000 mg | ORAL_CAPSULE | Freq: Three times a day (TID) | ORAL | 0 refills | Status: AC | PRN

## 2023-12-29 NOTE — ED Provider Notes (Signed)
 [facility]    CSN: [id number] Arrival date & time: 12/29/23  1115      History   Chief Complaint Chief Complaint  Patient presents with   Cough    Appointment  COVID+    HPI Brandi Nguyen is a 68 y.o. female presenting for fatigue, cough, congestion, sore throat, headaches, and body aches x 2 days.  Denies fever, ear pain, sinus pain, chest pain, wheezing, shortness of breath, abdominal pain, vomiting or diarrhea.   Patient has been exposed to COVID through relatives. Patient has been taking over-the-counter  meds. Positive at home COVID test. Desires Paxlovid. No other complaints.   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      Home Medications    Prior to Admission medications   Medication Sig Start Date End Date Taking? Authorizing Provider  atorvastatin (LIPITOR) 40 MG tablet Take 20 mg by mouth daily. Dr. Brenna    Yes [provider]  benzonatate  (TESSALON ) 200 MG capsule Take 1 capsule (200 mg total) by mouth 3 (three) times daily as needed. 12/29/23  Yes Arvis Huxley B, PA-C  DULoxetine  (CYMBALTA) 30 MG capsule Take by mouth. 10/19/21 12/29/23 Yes [provider]  nirmatrelvir/ritonavir (PAXLOVID, 300/100,) 20 x 150 MG & 10 x 100MG  TBPK Take 3 tablets by mouth 2 (two) times daily for 5 days. Patient GFR is over 60 Take nirmatrelvir (150 mg) two tablets twice daily for 5 days and ritonavir (100 mg) one tablet twice daily for 5 days. 12/29/23 01/03/24 Yes Arvis Huxley NOVAK, PA-C  Semaglutide, 1 MG/DOSE, 4 MG/3ML SOPN Inject into the skin. 08/21/23  Yes [provider]  ADDERALL XR 25 MG 24 hr capsule Take by mouth every morning.    [provider]  Coenzyme Q10 (CO Q 10) 100 MG CAPS Take by mouth.    [provider]  cyanocobalamin 1000 MCG tablet Take 1,000 mcg by mouth daily.    [provider]  ipratropium (ATROVENT ) 0.06 % nasal spray Place 2 sprays into both nostrils 4 (four) times daily. 02/19/22   Bernardino Ditch, NP  meclizine  (ANTIVERT ) 25 MG tablet Take 1 tablet (25 mg total) by mouth 3 (three) times daily as needed for dizziness. 10/27/21   Arvis Huxley NOVAK, PA-C  meloxicam (MOBIC) 15 MG tablet Take 15 mg by mouth daily.    [provider]  naproxen sodium (ALEVE) 220 MG tablet Take by mouth.    [provider]  OMEGA-3 FATTY ACIDS  PO Take 1 capsule by mouth daily.    [provider]  TURMERIC CURCUMIN PO Take by mouth.    [provider]    Family History Family History  Problem Relation Age of Onset   Heart failure Mother    Colon cancer Maternal Grandmother    Breast cancer Neg Hx     Social History Social History   Tobacco Use   Smoking status: Former   Smokeless tobacco: Never  Vaping Use   Vaping status: Never Used  Substance Use Topics   Alcohol use: Yes    Alcohol/week: 2.0 standard drinks of alcohol    Types: 2 Standard drinks or equivalent per week    Comment: occasionally   Drug use: No     Allergies   Prednisone  and Pregabalin   Review of Systems Review of Systems   Constitutional:  Positive for fatigue. Negative for chills, diaphoresis and fever.  HENT:  Positive for congestion, postnasal drip, rhinorrhea, sneezing and sore throat. Negative for ear pain, sinus pressure and sinus pain.   Respiratory:  Negative for cough and shortness of breath.   Gastrointestinal:  Negative for abdominal pain, nausea and vomiting.  Musculoskeletal:  Positive for myalgias.  Skin:  Negative for rash.  Neurological:  Positive for headaches. Negative for weakness.  Hematological:  Negative for adenopathy.     Physical Exam Triage Vital Signs ED Triage Vitals  Encounter Vitals Group     BP 12/29/23 1138 120/81     Girls Systolic BP Percentile --      Girls Diastolic BP Percentile --      Boys Systolic BP Percentile --      Boys Diastolic BP Percentile --      Pulse Rate 12/29/23 1138 76     Resp 12/29/23 1138 15     Temp 12/29/23 1138 98.5 F (36.9 C)     Temp Source 12/29/23 1138 Oral     SpO2 12/29/23 1138 100 %     Weight 12/29/23 1135 190 lb (86.2 kg)     Height 12/29/23 1135 6' (1.829 m)     Head Circumference --      Peak Flow --      Pain Score 12/29/23 1135 3     Pain Loc --      Pain Education --      Exclude from Growth Chart --    No data found.  Updated Vital Signs BP 120/81 (BP Location: Left Arm)   Pulse 76   Temp 98.5 F (36.9 C) (Oral)   Resp 15   Ht 6' (1.829 m)   Wt 190 lb (86.2 kg)   SpO2 100%   BMI 25.77 kg/m     Physical Exam Vitals and nursing note reviewed.  Constitutional:      General: She is not in acute distress.    Appearance: Normal appearance. She is not ill-appearing or toxic-appearing.  HENT:     Head: Normocephalic and atraumatic.     Nose: Congestion present.     Mouth/Throat:     Mouth: Mucous membranes are moist.     Pharynx: Oropharynx is clear. Posterior oropharyngeal erythema present.  Eyes:     General: No scleral icterus.       Right eye: No discharge.        Left eye: No discharge.      Conjunctiva/sclera: Conjunctivae normal.  Cardiovascular:     Rate and Rhythm: Normal rate and regular rhythm.  Heart sounds: Normal heart sounds.  Pulmonary:     Effort: Pulmonary effort is normal. No respiratory distress.     Breath sounds: Normal breath sounds.  Musculoskeletal:     Cervical back: Neck supple.  Skin:    General: Skin is dry.  Neurological:     General: No focal deficit present.     Mental Status: She is alert. Mental status is at baseline.     Motor: No weakness.     Gait: Gait normal.  Psychiatric:        Mood and Affect: Mood normal.        Behavior: Behavior normal.      UC Treatments / Results  Labs (all labs ordered are listed, but only abnormal results are displayed) Labs Reviewed  POC SOFIA SARS ANTIGEN FIA - Abnormal; Notable for the following components:      Result Value   SARS Coronavirus 2 Ag Positive (*)    All other components within normal limits  tains abnormal data Comprehensive Metabolic Panel (CMP) Order: [id number] Component Ref Range & Units 4 mo ago  Sodium 135 - 145 mmol/L 141  Potassium 3.5 - 5.0 mmol/L 4.2  Chloride 98 - 108 mmol/L 104  Carbon Dioxide (CO2) 21 - 30 mmol/L 28  Urea Nitrogen (BUN) 7 - 20 mg/dL 19  Creatinine 0.4 - 1.0 mg/dL 0.7  Glucose 70 - 859 mg/dL 82  Comment: Interpretive Data: Above is the NONFASTING reference range.  Below are the FASTING reference ranges: NORMAL:      70-99 mg/dL PREDIABETES: 899-874 mg/dL DIABETES:    > 874 mg/dL  Calcium 8.7 - 89.7 mg/dL 9.9  AST (Aspartate Aminotransferase) 15 - 41 U/L 27  ALT (Alanine Aminotransferase) 10 - 39 U/L 31  Bilirubin, Total 0.4 - 1.5 mg/dL 0.9  Alk Phos (Alkaline Phosphatase) 24 - 110 U/L 85  Albumin 3.5 - 4.8 g/dL 3.9  Protein, Total 6.2 - 8.1 g/dL 6.9  Anion Gap 3 - 12 mmol/L 9  BUN/CREA Ratio 6 - 27 28 High   Glomerular Filtration Rate (eGFR) mL/min/1.73sq m 95    EKG   Radiology No results  found.  Procedures Procedures (including critical care time)  Medications Ordered in UC Medications - No data to display  Initial Impression / Assessment and Plan / UC Course  I have reviewed the triage vital signs and the nursing notes.  Pertinent labs & imaging results that were available during my care of the patient were reviewed by me and considered in my medical decision making (see chart for details).   68 year old female presents for 2-day history of fatigue, cough, congestion, sneezing, runny nose, headaches and bodyaches.  Positive at home COVID test.  Desires treatment with Paxlovid.  Vitals are stable and normal and she is overall well-appearing.  No acute distress.  On exam she has nasal congestion.  Mild erythema posterior pharynx.  Chest clear.  Heart regular rate and rhythm.  Positive COVID antigen test in clinic.  Discussed treatment with Paxlovid.  Reviewed last CMP which was done 4 months ago.  Normal GFR.  Will prescribe Paxlovid at normal dose.  Advised to hold atorvastatin for 10 days.  Patient also requested Tessalon  Perles which I sent to pharmacy.  Reviewed current CDC guidelines, isolation protocol and ED precautions.   Final Clinical Impressions(s) / UC Diagnoses   Final diagnoses:  Acute cough  COVID-19  Sore throat     Discharge Instructions      - Positive  COVID test. - Isolate until fever free 24 hours and symptoms proving. - I sent Paxlovid to the pharmacy.  You should hold your statin cholesterol medication for 10 days.  You may start the Paxlovid 12 hours after your last dose of atorvastatin. - Increase rest and fluids.  I sent cough to support. - You need to be seen again if you have any uncontrolled fever, weakness or breathing trouble.   ED Prescriptions     Medication Sig Dispense Auth. Provider   nirmatrelvir/ritonavir (PAXLOVID, 300/100,) 20 x 150 MG & 10 x 100MG  TBPK Take 3 tablets by mouth 2 (two) times daily for 5 days. Patient  GFR is over 60 Take nirmatrelvir (150 mg) two tablets twice daily for 5 days and ritonavir (100 mg) one tablet twice daily for 5 days. 30 tablet Arvis Huxley B, PA-C   benzonatate  (TESSALON ) 200 MG capsule Take 1 capsule (200 mg total) by mouth 3 (three) times daily as needed. 30 capsule Arvis Huxley NOVAK, PA-C      PDMP not reviewed this encounter.   Arvis Huxley NOVAK, PA-C 12/29/23 1218

## 2023-12-29 NOTE — Discharge Instructions (Addendum)
-   Positive COVID test. - Isolate until fever free 24 hours and symptoms proving. - I sent Paxlovid to the pharmacy.  You should hold your statin cholesterol medication for 10 days.  You may start the Paxlovid 12 hours after your last dose of atorvastatin. - Increase rest and fluids.  I sent cough to support. - You need to be seen again if you have any uncontrolled fever, weakness or breathing trouble.

## 2023-12-29 NOTE — ED Triage Notes (Signed)
 Patient took home COVID test yesterday and tested positive.  Patient reports fatigue, cough, runny nose, that started on Thursday.  Patient unsure of fevers.

## 2024-02-26 ENCOUNTER — Ambulatory Visit

## 2024-02-26 ENCOUNTER — Ambulatory Visit
Admission: EM | Admit: 2024-02-26 | Discharge: 2024-02-26 | Disposition: A | Discharge status: Home / Self Care | Attending: Emergency Medicine | Admitting: Emergency Medicine

## 2024-02-26 ENCOUNTER — Ambulatory Visit: Payer: Self-pay | Admitting: Emergency Medicine

## 2024-02-26 DIAGNOSIS — J069 Acute upper respiratory infection, unspecified: Secondary | ICD-10-CM | POA: Diagnosis not present

## 2024-02-26 DIAGNOSIS — J9801 Acute bronchospasm: Secondary | ICD-10-CM

## 2024-02-26 DIAGNOSIS — R051 Acute cough: Secondary | ICD-10-CM | POA: Diagnosis not present

## 2024-02-26 MED ORDER — PREDNISONE 20 MG PO TABS: 40.0000 mg | ORAL_TABLET | Freq: Every day | ORAL | 0 refills | Status: AC

## 2024-02-26 MED ORDER — FLUTICASONE PROPIONATE 50 MCG/ACT NA SUSP: 2.0000 | Freq: Every day | NASAL | 0 refills | Status: AC

## 2024-02-26 MED ORDER — AEROCHAMBER MV MISC: 1 refills | Status: AC

## 2024-02-26 MED ORDER — DOXYCYCLINE HYCLATE 100 MG PO CAPS: 100.0000 mg | ORAL_CAPSULE | Freq: Two times a day (BID) | ORAL | 0 refills | Status: AC

## 2024-02-26 MED ORDER — IPRATROPIUM-ALBUTEROL 0.5-2.5 (3) MG/3ML IN SOLN
3.0000 mL | Freq: Once | RESPIRATORY_TRACT | Status: AC
  Administered 2024-02-26: 3 mL via RESPIRATORY_TRACT

## 2024-02-26 MED ORDER — ALBUTEROL SULFATE HFA 108 (90 BASE) MCG/ACT IN AERS: 1.0000 | INHALATION_SPRAY | RESPIRATORY_TRACT | 0 refills | Status: AC | PRN

## 2024-02-26 NOTE — Discharge Instructions (Signed)
 Take two puffs from your albuterol  inhaler with your spacer every 4-6 hours as needed for coughing, wheezing, chest tightness.  Finish the steroids unless your doctor tells you to stop. Make sure you drink extra fluids. Return to the ER if you get worse, have a fever >100.4, or any other concerns.   Tessalon  for cough.  Saline nasal irrigation with NeilMed sinus rinse and distilled water as often as you want, Flonase , Mucinex D and nasal congestion becomes bothersome.  I am sending you home with a wait-and-see prescription of doxycycline  if you are not better in 3 days, or free to start sooner if you get worse.  If the spacer is too expensive at the pharmacy, you can get an AeroChamber Z-Stat off of [employer] for about $10-$15.  Go to www.goodrx.com  or www.costplusdrugs.com to look up your medications. This will give you a list of where you can find your prescriptions at the most affordable prices. Or ask the pharmacist what the cash price is, or if they have any other discount programs available to help make your medication more affordable. This can be less expensive than what you would pay with insurance.

## 2024-02-26 NOTE — ED Provider Notes (Signed)
 " HPI  SUBJECTIVE:  Brandi Nguyen is a 69 y.o. female who presents with 7 days respiratory infection with nasal congestion, runny nose, body aches, headaches, sore throat, sinus pain and pressure, cough and fatigue.  She states that these have largely resolved but reports a persistent deep, heavy, productive cough, fatigue, intermittent diffuse minutes along chest tightness, and dyspnea on exertion.  No wheezing, shortness of breath, fevers, nausea, diaphoresis, radiation of the chest tightness up her neck, down her arm or through to her back.  She is unable to sleep at night without waking up coughing.  No antibiotics in the past 3 months.  No antipyretic in past 6 hours.  She has been taking NyQuil, cold and flu day/night cold medication with improvement in her symptoms.  No aggravating factors.  She has had similar chest tightness before with previous respiratory infections.  She has no past medical history.  Family history negative for early MI.  PCP: [facility].    Past Medical History:  Diagnosis Date   Anxiety    Cancer (HCC)    skin ca   Depression     Past Surgical History:  Procedure Laterality Date   COLONOSCOPY  2014   COLONOSCOPY  02/01/2016   one polyp   DILATION AND CURETTAGE OF UTERUS  2013   for post-menopausal bleeding   MELANOMA EXCISION  2015   MOHS SURGERY  12/10/2015   removed from hand   TUBAL LIGATION      Family History  Problem Relation Age of Onset   Heart failure Mother    Colon cancer Maternal Grandmother    Breast cancer Neg Hx     Social History[1]  Current Medications[2]  Allergies[3]   ROS  As noted in HPI.   Physical Exam  BP 114/80 (BP Location: Left Arm)   Pulse 100   Temp 98 F (36.7 C) (Oral)   Resp 16   Wt 84.8 kg   SpO2 100%   BMI 25.36 kg/m   Constitutional: Well developed, well nourished, no acute distress Eyes: PERRL, EOMI, conjunctiva normal bilaterally HENT: Normocephalic, atraumatic,mucus membranes  moist.  Positive nasal congestion.  Erythematous, swollen turbinates.  No sinus tenderness.  No postnasal drip. Respiratory: Fair air movement, clear to auscultation bilaterally, no rales, no wheezing, no rhonchi.  Positive anterior, lateral chest wall tenderness Cardiovascular: Normal rate and rhythm, no murmurs, no gallops, no rubs GI: nondistended skin: No rash, skin intact Musculoskeletal: no deformities Neurologic: Alert & oriented x 3, CN III-XII grossly intact, no motor deficits, sensation grossly intact Psychiatric: Speech and behavior appropriate   ED Course   Medications  ipratropium-albuterol  (DUONEB) 0.5-2.5 (3) MG/3ML nebulizer solution 3 mL (3 mLs Nebulization Given 02/26/24 1342)    Orders Placed This Encounter  Procedures   DG Chest 2 View    Standing Status:   Standing    Number of Occurrences:   1    Reason for Exam (SYMPTOM  OR DIAGNOSIS REQUIRED):   Cough 1 week rule out acute cardiopulmonary disease   No results found for this or any previous visit (from the past 24 hours). DG Chest 2 View Result Date: 02/26/2024 CLINICAL DATA:  Cough. EXAM: CHEST - 2 VIEW COMPARISON:  01/04/2010. FINDINGS: The heart size and mediastinal contours are within normal limits. No pulmonary edema, focal consolidation, pleural effusion, or pneumothorax. Degenerative changes of the thoracic spine. No acute osseous abnormality. IMPRESSION: No acute cardiopulmonary findings. Electronically Signed   By: Harrietta Sherry  M.D.   On: 02/26/2024 13:57    ED Clinical Impression  1. Viral URI with cough   2. Acute cough   3. Bronchospasm      ED Assessment/Plan     Reviewed imaging independently.  No acute cardiopulmonary disease. See radiology report for full details.  Discussed radiology results with patient while in department  Patient was given a DuoNeb.  On reevaluation, she states chest tightness significantly improved.  She has improved air movement.  Lungs still clear.  Patient  presents post URI.  I suspect she has some bronchospasm.  Home with an albuterol  inhaler with spacer every 4-6 hours, prednisone  40 mg for 5 days, Mucinex D, saline nasal irrigation, Flonase .  Tessalon  for cough.  States this works well for her.  She does not need a prescription for this.  She has no maxillary, frontal sinus tenderness.  I think that if she has a secondary infection, will most likely be a lower respiratory tract infection/pneumonia.  Even though her chest x-ray is normal today, will send home with a wait-and-see prescription of doxycycline  100 mg p.o. twice daily for 5 days in case she is not better in 3 days or if she gets worse.  She will follow-up with her primary care provider as needed.  She will go to the ED if she gets worse.  Discussed  imaging, MDM, treatment plan, and plan for follow-up with patient Discussed sn/sx that should prompt return to the ED. patient agrees with plan.   Meds ordered this encounter  Medications   ipratropium-albuterol  (DUONEB) 0.5-2.5 (3) MG/3ML nebulizer solution 3 mL   albuterol  (VENTOLIN  HFA) 108 (90 Base) MCG/ACT inhaler    Sig: Inhale 1-2 puffs into the lungs every 4 (four) hours as needed for wheezing or shortness of breath.    Dispense:  1 each    Refill:  0   fluticasone  (FLONASE ) 50 MCG/ACT nasal spray    Sig: Place 2 sprays into both nostrils daily.    Dispense:  16 g    Refill:  0   predniSONE  (DELTASONE ) 20 MG tablet    Sig: Take 2 tablets (40 mg total) by mouth daily with breakfast for 5 days.    Dispense:  10 tablet    Refill:  0   Spacer/Aero-Holding Chambers (AEROCHAMBER MV) inhaler    Sig: Use as instructed    Dispense:  1 each    Refill:  1   doxycycline  (VIBRAMYCIN ) 100 MG capsule    Sig: Take 1 capsule (100 mg total) by mouth 2 (two) times daily for 5 days.    Dispense:  10 capsule    Refill:  0      *This clinic note was created using Scientist, clinical (histocompatibility and immunogenetics). Therefore, there may be occasional mistakes despite  careful proofreading. ?      [1]  Social History Tobacco Use   Smoking status: Former   Smokeless tobacco: Never  Vaping Use   Vaping status: Never Used  Substance Use Topics   Alcohol use: Yes    Alcohol/week: 2.0 standard drinks of alcohol    Types: 2 Standard drinks or equivalent per week    Comment: occasionally   Drug use: No  [2] No current facility-administered medications for this encounter.  Current Outpatient Medications:    ADDERALL XR 25 MG 24 hr capsule, Take by mouth every morning., Disp: , Rfl:    albuterol  (VENTOLIN  HFA) 108 (90 Base) MCG/ACT inhaler, Inhale 1-2 puffs into the lungs every  4 (four) hours as needed for wheezing or shortness of breath., Disp: 1 each, Rfl: 0   atorvastatin (LIPITOR) 40 MG tablet, Take 20 mg by mouth daily. Dr. Brenna , Disp: , Rfl:    doxycycline  (VIBRAMYCIN ) 100 MG capsule, Take 1 capsule (100 mg total) by mouth 2 (two) times daily for 5 days., Disp: 10 capsule, Rfl: 0   fluticasone  (FLONASE ) 50 MCG/ACT nasal spray, Place 2 sprays into both nostrils daily., Disp: 16 g, Rfl: 0   hydrOXYzine (ATARAX) 10 MG tablet, Take 10 mg by mouth., Disp: , Rfl:    predniSONE  (DELTASONE ) 20 MG tablet, Take 2 tablets (40 mg total) by mouth daily with breakfast for 5 days., Disp: 10 tablet, Rfl: 0   Semaglutide, 1 MG/DOSE, 4 MG/3ML SOPN, Inject into the skin., Disp: , Rfl:    Spacer/Aero-Holding Chambers (AEROCHAMBER MV) inhaler, Use as instructed, Disp: 1 each, Rfl: 1   benzonatate  (TESSALON ) 200 MG capsule, Take 1 capsule (200 mg total) by mouth 3 (three) times daily as needed., Disp: 30 capsule, Rfl: 0   Coenzyme Q10 (CO Q 10) 100 MG CAPS, Take by mouth., Disp: , Rfl:    cyanocobalamin 1000 MCG tablet, Take 1,000 mcg by mouth daily., Disp: , Rfl:    DULoxetine (CYMBALTA) 30 MG capsule, Take by mouth., Disp: , Rfl:    meclizine  (ANTIVERT ) 25 MG tablet, Take 1 tablet (25 mg total) by mouth 3 (three) times daily as needed for dizziness., Disp: 30  tablet, Rfl: 0   meloxicam (MOBIC) 15 MG tablet, Take 15 mg by mouth daily., Disp: , Rfl:    naproxen sodium (ALEVE) 220 MG tablet, Take by mouth., Disp: , Rfl:    OMEGA-3 FATTY ACIDS PO, Take 1 capsule by mouth daily., Disp: , Rfl:    TURMERIC CURCUMIN PO, Take by mouth., Disp: , Rfl:  [3]  Allergies Allergen Reactions   Prednisone  Other (See Comments)   Pregabalin Other (See Comments)    Causes aches and stiffness     Van Knee, MD 02/28/24 [phone]  "

## 2024-02-26 NOTE — ED Triage Notes (Signed)
 Sx x 1 week  Fatigue Productive cough   Took home covid and flu that were negative

## 2024-02-27 ENCOUNTER — Ambulatory Visit: Payer: Self-pay
